# Patient Record
Sex: Male | Born: 1990 | Race: Black or African American | Hispanic: No | Marital: Single | Smoking: Never smoker
Health system: Southern US, Community
[De-identification: ages and names within clinical notes are randomized; demographics above are authoritative.]

## PROBLEM LIST (undated history)

## (undated) DIAGNOSIS — G129 Spinal muscular atrophy, unspecified: Secondary | ICD-10-CM

## (undated) DIAGNOSIS — T8859XA Other complications of anesthesia, initial encounter: Secondary | ICD-10-CM

## (undated) DIAGNOSIS — J302 Other seasonal allergic rhinitis: Secondary | ICD-10-CM

## (undated) DIAGNOSIS — Z993 Dependence on wheelchair: Secondary | ICD-10-CM

## (undated) DIAGNOSIS — K649 Unspecified hemorrhoids: Secondary | ICD-10-CM

## (undated) HISTORY — PX: WISDOM TOOTH EXTRACTION: SHX21

## (undated) HISTORY — PX: TENDON RELEASE: SHX230

## (undated) HISTORY — PX: MUSCLE BIOPSY: SHX716

## (undated) HISTORY — PX: BACK SURGERY: SHX140

---
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
## 2009-11-24 ENCOUNTER — Emergency Department (HOSPITAL_COMMUNITY): Admission: EM | Admit: 2009-11-24 | Discharge: 2009-11-25 | Payer: Self-pay | Admitting: Emergency Medicine

## 2011-10-08 DIAGNOSIS — G129 Spinal muscular atrophy, unspecified: Secondary | ICD-10-CM | POA: Insufficient documentation

## 2013-02-13 ENCOUNTER — Emergency Department (HOSPITAL_COMMUNITY)
Admission: EM | Admit: 2013-02-13 | Discharge: 2013-02-13 | Disposition: A | Discharge status: Home / Self Care | Payer: Medicaid Other | Attending: Emergency Medicine | Admitting: Emergency Medicine

## 2013-02-13 ENCOUNTER — Emergency Department (HOSPITAL_COMMUNITY): Payer: Medicaid Other

## 2013-02-13 ENCOUNTER — Encounter (HOSPITAL_COMMUNITY): Payer: Self-pay | Admitting: *Deleted

## 2013-02-13 DIAGNOSIS — Z8739 Personal history of other diseases of the musculoskeletal system and connective tissue: Secondary | ICD-10-CM | POA: Insufficient documentation

## 2013-02-13 DIAGNOSIS — R059 Cough, unspecified: Secondary | ICD-10-CM | POA: Insufficient documentation

## 2013-02-13 DIAGNOSIS — Z880 Allergy status to penicillin: Secondary | ICD-10-CM | POA: Insufficient documentation

## 2013-02-13 DIAGNOSIS — R6889 Other general symptoms and signs: Secondary | ICD-10-CM | POA: Insufficient documentation

## 2013-02-13 DIAGNOSIS — J9801 Acute bronchospasm: Secondary | ICD-10-CM | POA: Insufficient documentation

## 2013-02-13 DIAGNOSIS — Z79899 Other long term (current) drug therapy: Secondary | ICD-10-CM | POA: Insufficient documentation

## 2013-02-13 DIAGNOSIS — Z792 Long term (current) use of antibiotics: Secondary | ICD-10-CM | POA: Insufficient documentation

## 2013-02-13 DIAGNOSIS — R05 Cough: Secondary | ICD-10-CM | POA: Insufficient documentation

## 2013-02-13 HISTORY — DX: Other seasonal allergic rhinitis: J30.2

## 2013-02-13 HISTORY — DX: Spinal muscular atrophy, unspecified: G12.9

## 2013-02-13 MED ORDER — ALBUTEROL SULFATE HFA 108 (90 BASE) MCG/ACT IN AERS
2.0000 | INHALATION_SPRAY | Freq: Once | RESPIRATORY_TRACT | Status: AC
  Administered 2013-02-13: 2 via RESPIRATORY_TRACT
  Filled 2013-02-13: qty 6.7

## 2013-02-13 NOTE — ED Provider Notes (Signed)
History     CSN: [id number]  Arrival date & time 02/13/13  1931   First MD Initiated Contact with Patient 02/13/13 1948      Chief Complaint  Patient presents with  . Shortness of Breath  . Choking    (Consider location/radiation/quality/duration/timing/severity/associated sxs/prior treatment) HPI Comments: Patient comes to the ER for evaluation of difficulty breathing. Patient had a choking/coughing episode and felt short of breath. He used his albuterol inhaler and no shortness of breath completely resolved. He is back to his normal baseline. At arrival patient is in no distress. He has not had any fever.  Patient is a 22 y.o. male presenting with shortness of breath.  Shortness of Breath   Past Medical History  Diagnosis Date  . Spinal muscular atrophy   . Seasonal allergies     Past Surgical History  Procedure Laterality Date  . Back surgery    . Muscle biopsy    . Tendon release      History reviewed. No pertinent family history.  History  Substance Use Topics  . Smoking status: Never Smoker   . Smokeless tobacco: Not on file  . Alcohol Use: No      Review of Systems  Respiratory: Positive for shortness of breath.   All other systems reviewed and are negative.    Allergies  Penicillins  Home Medications   Current Outpatient Rx  Name  Route  Sig  Dispense  Refill  . albuterol (PROVENTIL HFA;VENTOLIN HFA) 108 (90 BASE) MCG/ACT inhaler   Inhalation   Inhale 2 puffs into the lungs every 6 (six) hours as needed for wheezing.         . clindamycin (CLEOCIN) 300 MG capsule   Oral   Take 300 mg by mouth 3 (three) times daily.         . naproxen sodium (ANAPROX) 220 MG tablet   Oral   Take 440 mg by mouth 2 (two) times daily as needed (for pain).           BP 130/84  Pulse 81  Temp(Src) 97.8 F (36.6 C) (Oral)  Resp 20  SpO2 100%  Physical Exam  Constitutional: He is oriented to person, place, and time. He appears well-developed and  well-nourished. No distress.  HENT:  Head: Normocephalic and atraumatic.  Right Ear: Hearing normal.  Nose: Nose normal.  Mouth/Throat: Oropharynx is clear and moist and mucous membranes are normal.  Eyes: Conjunctivae and EOM are normal. Pupils are equal, round, and reactive to light.  Neck: Normal range of motion. Neck supple.  Cardiovascular: Normal rate, regular rhythm, S1 normal and S2 normal.  Exam reveals no gallop and no friction rub.   No murmur heard. Pulmonary/Chest: Effort normal and breath sounds normal. No respiratory distress. He exhibits no tenderness.  Abdominal: Soft. Normal appearance and bowel sounds are normal. There is no hepatosplenomegaly. There is no tenderness. There is no rebound, no guarding, no tenderness at McBurney's point and negative Murphy's sign. No hernia.  Musculoskeletal: Normal range of motion.  Neurological: He is alert and oriented to person, place, and time. He has normal strength. No cranial nerve deficit. GCS eye subscore is 4. GCS verbal subscore is 5. GCS motor subscore is 6.  Skin: Skin is warm, dry and intact. No rash noted. No cyanosis.  Psychiatric: He has a normal mood and affect. His speech is normal and behavior is normal. Thought content normal.    ED Course  Procedures (including critical care time)  Labs Reviewed - No data to display Dg Chest 2 View  02/13/2013  *RADIOLOGY REPORT*  Clinical Data: Shortness of breath and cough.  CHEST - 2 VIEW  Comparison: 11/25/2009 chest radiograph  Findings: Cardiomegaly noted. There is no evidence of focal airspace disease, pulmonary edema, suspicious pulmonary nodule/mass, pleural effusion, or pneumothorax. No acute bony abnormalities are identified. Thoracic spinal fixation rods are again noted.  IMPRESSION: Cardiomegaly without evidence of acute cardiopulmonary disease.   Original Report Authenticated By: Harmon Pier, M.D.      Diagnoses: Shortness of breath secondary to bronchospasm    MDM   Patient came to the ER for evaluation of shortness of breath. He had an episode of coughing and feeling short of breath that resolved after he used his albuterol inhaler. At arrival lungs are completely clear. Oxygenation is 100%. All vital signs are normal. Chest x-ray was clear. Patient and parents were reassured, use albuterol as needed. No other treatment is necessary.        Gilda Crease, MD 02/13/13 2211

## 2013-02-13 NOTE — ED Notes (Signed)
Pt has spinal musculary atrophy, at 6:50 tonight, pt had episode of choking, felt he couldn't breathe.  Concern for something in throat.  Usually goes to [facility] for regular treatment of disease.

## 2013-07-15 ENCOUNTER — Encounter (HOSPITAL_COMMUNITY): Payer: Self-pay | Admitting: *Deleted

## 2013-07-15 ENCOUNTER — Emergency Department (HOSPITAL_COMMUNITY)
Admission: EM | Admit: 2013-07-15 | Discharge: 2013-07-15 | Disposition: A | Discharge status: Home / Self Care | Payer: Medicaid Other | Attending: Emergency Medicine | Admitting: Emergency Medicine

## 2013-07-15 DIAGNOSIS — H9209 Otalgia, unspecified ear: Secondary | ICD-10-CM | POA: Insufficient documentation

## 2013-07-15 DIAGNOSIS — Z792 Long term (current) use of antibiotics: Secondary | ICD-10-CM | POA: Insufficient documentation

## 2013-07-15 DIAGNOSIS — J029 Acute pharyngitis, unspecified: Secondary | ICD-10-CM | POA: Insufficient documentation

## 2013-07-15 DIAGNOSIS — Z79899 Other long term (current) drug therapy: Secondary | ICD-10-CM | POA: Insufficient documentation

## 2013-07-15 DIAGNOSIS — J069 Acute upper respiratory infection, unspecified: Secondary | ICD-10-CM | POA: Insufficient documentation

## 2013-07-15 DIAGNOSIS — H6120 Impacted cerumen, unspecified ear: Secondary | ICD-10-CM | POA: Insufficient documentation

## 2013-07-15 DIAGNOSIS — Z880 Allergy status to penicillin: Secondary | ICD-10-CM | POA: Insufficient documentation

## 2013-07-15 MED ORDER — PSEUDOEPHEDRINE HCL 60 MG PO TABS: 60.0000 mg | ORAL_TABLET | ORAL | Status: DC | PRN

## 2013-07-15 NOTE — ED Notes (Signed)
Pt states that he has been having URI (cold like symptoms) for the past couple of days. Pt states nasal congestion, and cough that is productive but not always and generalized body discomfort.

## 2013-07-15 NOTE — ED Provider Notes (Signed)
CSN: [id number]     Arrival date & time 07/15/13  2003 History  This chart was scribed for non-physician practitioner, Junious Silk, PA-C working with Shelda Jakes, MD by Greggory Stallion, ED scribe. This patient was seen in room TR10C/TR10C and the patient's care was started at 10:06 PM.   Chief Complaint  Patient presents with  . URI   The history is provided by the patient. No language interpreter was used.    HPI Comments: Wayne Nichols is a 22 y.o. male who presents to the Emergency Department complaining of gradual onset, constant nasal congestion, sore throat and clear rhinorrhea that started 2 days ago. He states he has left ear pain and sometimes has a productive cough. The cough is worse at night. Pt denies trouble breathing, SOB and fever. He believes possibly one of his classmates is sick with the same symptoms.   Past Medical History  Diagnosis Date  . Spinal muscular atrophy   . Seasonal allergies    Past Surgical History  Procedure Laterality Date  . Back surgery    . Muscle biopsy    . Tendon release     History reviewed. No pertinent family history. History  Substance Use Topics  . Smoking status: Never Smoker   . Smokeless tobacco: Not on file  . Alcohol Use: No    Review of Systems  Constitutional: Negative for fever.  HENT: Positive for ear pain, congestion and rhinorrhea.   Respiratory: Positive for cough. Negative for shortness of breath.   All other systems reviewed and are negative.    Allergies  Penicillins  Home Medications   Current Outpatient Rx  Name  Route  Sig  Dispense  Refill  . albuterol (PROVENTIL HFA;VENTOLIN HFA) 108 (90 BASE) MCG/ACT inhaler   Inhalation   Inhale 2 puffs into the lungs every 6 (six) hours as needed for wheezing.         . clindamycin (CLEOCIN) 300 MG capsule   Oral   Take 300 mg by mouth 3 (three) times daily.         . naproxen sodium (ANAPROX) 220 MG tablet   Oral   Take 440 mg by mouth 2  (two) times daily as needed (for pain).          BP 127/78  Pulse 89  Temp(Src) 98 F (36.7 C) (Oral)  Resp 16  SpO2 98%  Physical Exam  Nursing note and vitals reviewed. Constitutional: He is oriented to person, place, and time. He appears well-developed and well-nourished. No distress.  HENT:  Head: Normocephalic and atraumatic.  Right Ear: Tympanic membrane, external ear and ear canal normal.  Left Ear: External ear normal.  Nose: Nose normal. Right sinus exhibits no maxillary sinus tenderness and no frontal sinus tenderness. Left sinus exhibits no maxillary sinus tenderness and no frontal sinus tenderness.  Mouth/Throat: Uvula is midline, oropharynx is clear and moist and mucous membranes are normal.  Cerumen impaction on left  Eyes: Conjunctivae are normal.  Neck: Normal range of motion. No tracheal deviation present.  Cardiovascular: Normal rate, regular rhythm and normal heart sounds.   Pulmonary/Chest: Effort normal and breath sounds normal. No stridor. He has no decreased breath sounds. He has no wheezes. He has no rhonchi. He has no rales.  Abdominal: Soft. He exhibits no distension. There is no tenderness.  Musculoskeletal: Normal range of motion.  Neurological: He is alert and oriented to person, place, and time. He displays atrophy.  Skin: Skin is  warm and dry. He is not diaphoretic.  Psychiatric: He has a normal mood and affect. His behavior is normal.    ED Course  Procedures (including critical care time)  DIAGNOSTIC STUDIES: Oxygen Saturation is 98% on RA, normal by my interpretation.    COORDINATION OF CARE: 10:08 PM-Discussed treatment plan which includes cleaning out left ear with pt at bedside and pt agreed to plan.   Labs Review Labs Reviewed - No data to display Imaging Review No results found.  MDM   1. URI (upper respiratory infection)    Patients symptoms are consistent with URI, likely viral etiology. Lungs CTA. Oxygen sats 98% on RA.  Discussed that antibiotics are not indicated for viral infections. Pt will be discharged with symptomatic treatment.  Verbalizes understanding and is agreeable with plan. Pt is hemodynamically stable & in NAD prior to dc.  Patient also with cerumen impaction. After ear was flushed out by nurse, patient reports he has no ear pain.    I personally performed the services described in this documentation, which was scribed in my presence. The recorded information has been reviewed and is accurate.    Mora Bellman, PA-C 07/16/13 1244

## 2013-07-22 NOTE — ED Provider Notes (Signed)
Medical screening examination/treatment/procedure(s) were performed by non-physician practitioner and as supervising physician I was immediately available for consultation/collaboration.   Kyara Boxer W. Joniel Graumann, MD 07/22/13 2357 

## 2014-12-10 ENCOUNTER — Encounter (HOSPITAL_COMMUNITY): Payer: Self-pay | Admitting: Emergency Medicine

## 2014-12-10 ENCOUNTER — Emergency Department (HOSPITAL_COMMUNITY)
Admission: EM | Admit: 2014-12-10 | Discharge: 2014-12-10 | Disposition: A | Discharge status: Home / Self Care | Payer: Medicaid Other | Attending: Emergency Medicine | Admitting: Emergency Medicine

## 2014-12-10 DIAGNOSIS — Z880 Allergy status to penicillin: Secondary | ICD-10-CM | POA: Diagnosis not present

## 2014-12-10 DIAGNOSIS — Z8669 Personal history of other diseases of the nervous system and sense organs: Secondary | ICD-10-CM | POA: Diagnosis not present

## 2014-12-10 DIAGNOSIS — K645 Perianal venous thrombosis: Secondary | ICD-10-CM

## 2014-12-10 DIAGNOSIS — Z79899 Other long term (current) drug therapy: Secondary | ICD-10-CM | POA: Insufficient documentation

## 2014-12-10 HISTORY — DX: Unspecified hemorrhoids: K64.9

## 2014-12-10 MED ORDER — LIDOCAINE-EPINEPHRINE 1 %-1:100000 IJ SOLN
10.0000 mL | Freq: Once | INTRAMUSCULAR | Status: AC
  Administered 2014-12-10: 10 mL via INTRADERMAL
  Filled 2014-12-10: qty 1

## 2014-12-10 NOTE — ED Provider Notes (Signed)
CSN: [id number]     Arrival date & time 12/10/14  0004 History  This chart was scribed for Tomasita CrumbleAdeleke Dwayn Moravek, MD by Abel PrestoKara Demonbreun, ED Scribe. This patient was seen in room B18C/B18C and the patient's care was started at 12:59 AM.      Chief Complaint  Patient presents with  . Hemorrhoids      The history is provided by the patient. No language interpreter was used.   HPI Comments: Wayne Nichols is a 24 y.o. male with PMHx of spinal muscular atrophy who presents to the Emergency Department complaining of hemorrhoid swelling with onset 4 days ago. Pt notes associated diarrhea. Pt with h/o hemorrhoids  But never this severe. Pt denies constipation, nausea, vomiting, anal bleeding, and abdominal pain.   Past Medical History  Diagnosis Date  . Spinal muscular atrophy   . Seasonal allergies   . Hemorrhoids    Past Surgical History  Procedure Laterality Date  . Back surgery    . Muscle biopsy    . Tendon release     No family history on file. History  Substance Use Topics  . Smoking status: Never Smoker   . Smokeless tobacco: Not on file  . Alcohol Use: No    Review of Systems A complete 10 system review of systems was obtained and all systems are negative except as noted in the HPI and PMH.     Allergies  Penicillins  Home Medications   Prior to Admission medications   Medication Sig Start Date End Date Taking? Authorizing Provider  albuterol (PROVENTIL HFA;VENTOLIN HFA) 108 (90 BASE) MCG/ACT inhaler Inhale 2 puffs into the lungs every 6 (six) hours as needed for wheezing.   Yes Historical Provider, MD  naproxen sodium (ANAPROX) 220 MG tablet Take 220-440 mg by mouth daily as needed (pain).   Yes Historical Provider, MD  phenylephrine-shark liver oil-mineral oil-petrolatum (PREPARATION H) 0.25-3-14-71.9 % rectal ointment Place 1 application rectally 2 (two) times daily as needed for hemorrhoids.   Yes Historical Provider, MD  ibuprofen (ADVIL,MOTRIN) 200 MG tablet Take  200-400 mg by mouth every 6 (six) hours as needed for moderate pain.     Historical Provider, MD  loratadine (CLARITIN) 10 MG tablet Take 10 mg by mouth daily.    Historical Provider, MD  pseudoephedrine (SUDAFED) 60 MG tablet Take 1 tablet (60 mg total) by mouth every 4 (four) hours as needed for congestion. Patient not taking: Reported on 12/10/2014 07/15/13   Mora BellmanHannah S Merrell, PA-C   BP 112/78 mmHg  Pulse 85  Temp(Src) 98.3 F (36.8 C) (Oral)  Resp 14  Ht 5\' 4"  (1.626 m)  Wt 108 lb (48.988 kg)  BMI 18.53 kg/m2  SpO2 97% Physical Exam  Constitutional: He is oriented to person, place, and time. Vital signs are normal. He appears well-developed and well-nourished.  Non-toxic appearance. He does not appear ill. No distress.  HENT:  Head: Normocephalic and atraumatic.  Nose: Nose normal.  Mouth/Throat: Oropharynx is clear and moist. No oropharyngeal exudate.  Eyes: Conjunctivae and EOM are normal. Pupils are equal, round, and reactive to light. No scleral icterus.  Neck: Normal range of motion. Neck supple. No tracheal deviation, no edema, no erythema and normal range of motion present. No thyroid mass and no thyromegaly present.  Cardiovascular: Normal rate, regular rhythm, S1 normal, S2 normal, normal heart sounds, intact distal pulses and normal pulses.  Exam reveals no gallop and no friction rub.   No murmur heard. Pulses:  Radial pulses are 2+ on the right side, and 2+ on the left side.       Dorsalis pedis pulses are 2+ on the right side, and 2+ on the left side.  Pulmonary/Chest: Effort normal and breath sounds normal. No respiratory distress. He has no wheezes. He has no rhonchi. He has no rales.  Abdominal: Soft. Normal appearance and bowel sounds are normal. He exhibits no distension, no ascites and no mass. There is no hepatosplenomegaly. There is no tenderness. There is no rebound, no guarding and no CVA tenderness.  Genitourinary:  3 cm external hemorrhoid at the 7 o'clock  position  Musculoskeletal: Normal range of motion. He exhibits no edema or tenderness.  Muscular dystrophy LE  Lymphadenopathy:    He has no cervical adenopathy.  Neurological: He is alert and oriented to person, place, and time. He has normal strength. No cranial nerve deficit or sensory deficit.  Skin: Skin is warm, dry and intact. No petechiae and no rash noted. He is not diaphoretic. No erythema. No pallor.  Psychiatric: He has a normal mood and affect. His behavior is normal. Judgment normal.  Nursing note and vitals reviewed.   ED Course  Procedures (including critical care time) DIAGNOSTIC STUDIES: Oxygen Saturation is 97% on room air, normal by my interpretation.    COORDINATION OF CARE: 1:02 AM Discussed treatment plan with patient at beside, the patient agrees with the plan and has no further questions at this time.   INCISION AND DRAINAGE PROCEDURE NOTE: Patient identification was confirmed and verbal consent was obtained. This procedure was performed by Tomasita Crumble, MD at 1:42 AM. Site: external  Anal hemorrhoid Sterile procedures observed Needle size: 25 g Anesthetic used (type and amt): lidocaine 1% with Epi Blade size: #11  Drainage: thrombosis Complexity: Complex Packing used: none Site anesthetized, incision made over site, wound drained and explored loculations, rinsed with copious amounts of normal saline, covered with dry, sterile dressing.  Pt tolerated procedure well without complications.  Instructions for care discussed verbally and pt provided with additional written instructions for homecare and f/u.   Labs Review Labs Reviewed - No data to display  Imaging Review No results found.   EKG Interpretation None      MDM   Final diagnoses:  None   patient presents emergency department for pain at his hemorrhoid site. On exam a thrombosed hemorrhoid was seen. C incision and drainage noted above. Patient states his pain is improved after the  procedure. Education was given including sitz bath and need for stool softeners if there is history of constipation. Patient's vital signs were within his normal limits he is safe for discharge.  I personally performed the services described in this documentation, which was scribed in my presence. The recorded information has been reviewed and is accurate.    Tomasita Crumble, MD 12/10/14 0230

## 2014-12-10 NOTE — ED Notes (Signed)
Will need to weigh patient once on bed. Patient does not know how much his wheelchair weighs.

## 2014-12-10 NOTE — ED Notes (Signed)
Placed on stretcher and clothing removed.  Family at the bedside.  No distress noted at this time.  Encouraged to call for assistance as needed.

## 2014-12-10 NOTE — ED Notes (Signed)
Pt. reports hemorrhoid pain with swelling onset Monday this week with no bleeding .

## 2014-12-10 NOTE — Discharge Instructions (Signed)
Hemorrhoids Wayne Nichols, your hemorrhoid was opened and drained.  Take sitz baths to prevent further hemorrhoids.   Follow-up with a primary care physician within 3 days for continued management. If symptoms worsen come back to emergency department immediately. Thank you. Hemorrhoids are puffy (swollen) veins around the rectum or anus. Hemorrhoids can cause pain, itching, bleeding, or irritation. HOME CARE 1. Eat foods with fiber, such as whole grains, beans, nuts, fruits, and vegetables. Ask your doctor about taking products with added fiber in them (fibersupplements). 2. Drink enough fluid to keep your pee (urine) clear or pale yellow. 3. Exercise often. 4. Go to the bathroom when you have the urge to poop. Do not wait. 5. Avoid straining to poop (bowel movement). 6. Keep the butt area dry and clean. Use wet toilet paper or moist paper towels. 7. Medicated creams and medicine inserted into the anus (anal suppository) may be used or applied as told. 8. Only take medicine as told by your doctor. 9. Take a warm water bath (sitz bath) for 15-20 minutes to ease pain. Do this 3-4 times a day. 10. Place ice packs on the area if it is tender or puffy. Use the ice packs between the warm water baths. 1. Put ice in a plastic bag. 2. Place a towel between your skin and the bag. 3. Leave the ice on for 15-20 minutes, 03-04 times a day. 11. Do not use a donut-shaped pillow or sit on the toilet for a long time. GET HELP RIGHT AWAY IF:   You have more pain that is not controlled by treatment or medicine.  You have bleeding that will not stop.  You have trouble or are unable to poop (bowel movement).  You have pain or puffiness outside the area of the hemorrhoids. MAKE SURE YOU:   Understand these instructions.  Will watch your condition.  Will get help right away if you are not doing well or get worse. Document Released: 07/17/2008 Document Revised: 09/24/2012 Document Reviewed:  08/19/2012 [facility] Patient Information 2015 [place], [place]. This information is not intended to replace advice given to you by your health care provider. Make sure you discuss any questions you have with your health care provider.  Sitz Bath A sitz bath is a warm water bath taken in the sitting position. The water covers only the hips and butt (buttocks). It may be used for either healing or cleaning purposes. Sitz baths are also used to relieve pain, itching, or muscle tightening (spasms). The water may contain medicine. Moist heat will help you heal and relax.  HOME CARE  Take 3 to 4 sitz baths a day. 12. Fill the bathtub half-full with warm water. 13. Sit in the water and open the drain a little. 14. Turn on the warm water to keep the tub half-full. Keep the water running constantly. 15. Soak in the water for 15 to 20 minutes. 16. After the sitz bath, pat the affected area dry. GET HELP RIGHT AWAY IF: You get worse instead of better. Stop the sitz baths if you get worse. MAKE SURE YOU:  Understand these instructions.  Will watch your condition.  Will get help right away if you are not doing well or get worse. Document Released: 11/15/2004 Document Revised: 07/02/2012 Document Reviewed: 02/05/2011 [facility] Patient Information 2015 [place], [place]. This information is not intended to replace advice given to you by your health care provider. Make sure you discuss any questions you have with your health care provider.

## 2014-12-21 ENCOUNTER — Ambulatory Visit: Payer: Medicaid Other | Attending: Internal Medicine | Admitting: Internal Medicine

## 2014-12-21 ENCOUNTER — Encounter: Payer: Self-pay | Admitting: Internal Medicine

## 2014-12-21 VITALS — BP 108/76 | HR 101 | Temp 98.1°F | Resp 16

## 2014-12-21 DIAGNOSIS — K649 Unspecified hemorrhoids: Secondary | ICD-10-CM

## 2014-12-21 DIAGNOSIS — Z2821 Immunization not carried out because of patient refusal: Secondary | ICD-10-CM | POA: Diagnosis not present

## 2014-12-21 DIAGNOSIS — L709 Acne, unspecified: Secondary | ICD-10-CM

## 2014-12-21 DIAGNOSIS — Z993 Dependence on wheelchair: Secondary | ICD-10-CM | POA: Insufficient documentation

## 2014-12-21 DIAGNOSIS — G129 Spinal muscular atrophy, unspecified: Secondary | ICD-10-CM | POA: Diagnosis not present

## 2014-12-21 NOTE — Progress Notes (Signed)
Pt is here to establish care. Pt was recently in the ED with hemorrhoids. Pt is treating with preparation h. Pt states that he is a lot better.

## 2014-12-21 NOTE — Patient Instructions (Signed)

## 2014-12-21 NOTE — Progress Notes (Signed)
Patient ID: ROWAN BLAKER, male   DOB: Jul 07, 1991, 24 y.o.   MRN: [id number]  CC: hemorrhoids   HPI: Artavis Cowie is a 24 y.o. male here today to establish care.  Patient has past medical history of spinal muscular atrophy.  He was recently seen in the ER for external thrombosed hemorrhoids. At that time he had a I&D of the hemorrhoid.  Patient also c/o of facial and back acne. He has tried clearogel and proactive with minimal relief.   Patient has No headache, No chest pain, No abdominal pain - No Nausea, No new weakness tingling or numbness, No Cough - SOB.  Allergies  Allergen Reactions  . Penicillins Hives   Past Medical History  Diagnosis Date  . Spinal muscular atrophy   . Seasonal allergies   . Hemorrhoids    Current Outpatient Prescriptions on File Prior to Visit  Medication Sig Dispense Refill  . albuterol (PROVENTIL HFA;VENTOLIN HFA) 108 (90 BASE) MCG/ACT inhaler Inhale 2 puffs into the lungs every 6 (six) hours as needed for wheezing.    Marland Kitchen ibuprofen (ADVIL,MOTRIN) 200 MG tablet Take 200-400 mg by mouth every 6 (six) hours as needed for moderate pain.     Marland Kitchen loratadine (CLARITIN) 10 MG tablet Take 10 mg by mouth daily.    . naproxen sodium (ANAPROX) 220 MG tablet Take 220-440 mg by mouth daily as needed (pain).    . phenylephrine-shark liver oil-mineral oil-petrolatum (PREPARATION H) 0.25-3-14-71.9 % rectal ointment Place 1 application rectally 2 (two) times daily as needed for hemorrhoids.    . pseudoephedrine (SUDAFED) 60 MG tablet Take 1 tablet (60 mg total) by mouth every 4 (four) hours as needed for congestion. (Patient not taking: Reported on 12/10/2014) 30 tablet 0   No current facility-administered medications on file prior to visit.   Family History  Problem Relation Age of Onset  . Stroke Maternal Grandmother   . Heart disease Maternal Grandmother    History   Social History  . Marital Status: Single    Spouse Name: N/A  . Number of Children: N/A  .  Years of Education: N/A   Occupational History  . Not on file.   Social History Main Topics  . Smoking status: Never Smoker   . Smokeless tobacco: Not on file  . Alcohol Use: No  . Drug Use: No  . Sexual Activity: Not on file   Other Topics Concern  . Not on file   Social History Narrative    Review of Systems: See HPI   Objective:   Filed Vitals:   12/21/14 1707  BP: 108/76  Pulse: 101  Temp: 98.1 F (36.7 C)  Resp: 16    Physical Exam  Constitutional: He is oriented to person, place, and time.  Cardiovascular: Normal rate, regular rhythm and normal heart sounds.   Pulmonary/Chest: Effort normal and breath sounds normal.  Musculoskeletal:  BLE muscle atropy, limited to wheelchair  Neurological: He is alert and oriented to person, place, and time.  Skin: Skin is warm and dry.     No results found for: WBC, HGB, HCT, MCV, PLT No results found for: CREATININE, BUN, NA, K, CL, CO2  No results found for: HGBA1C Lipid Panel  No results found for: CHOL, TRIG, HDL, CHOLHDL, VLDL, LDLCALC     Assessment and plan:   Edelmiro was seen today for establish care.  Diagnoses and all orders for this visit:  Hemorrhoids, unspecified hemorrhoid type May continue to use preparation H. Gave patient  s/s of when to return to clinic  Acne, unspecified acne type Orders: -     Ambulatory referral to Dermatology  Refused influenza vaccine Explained that annual influenza is recommended per CDC guidelines and is highly suggested to anyone who has has CHF, COPD, DM or immunocompromised. Benefits of influenza described in detail.   Return in about 3 months (around 03/23/2015), or if symptoms worsen or fail to improve.       Holland CommonsKECK, VALERIE, NP-C [facility] [phone] 12/21/2014, 5:27 PM

## 2015-01-27 ENCOUNTER — Encounter: Payer: Self-pay | Admitting: Internal Medicine

## 2015-01-27 ENCOUNTER — Other Ambulatory Visit: Payer: Self-pay | Admitting: *Deleted

## 2015-01-27 DIAGNOSIS — J302 Other seasonal allergic rhinitis: Secondary | ICD-10-CM

## 2015-01-27 DIAGNOSIS — Z889 Allergy status to unspecified drugs, medicaments and biological substances status: Secondary | ICD-10-CM

## 2015-01-27 MED ORDER — LORATADINE 10 MG PO TABS: 10.0000 mg | ORAL_TABLET | Freq: Every day | ORAL | Status: DC | PRN

## 2015-01-27 NOTE — Progress Notes (Signed)
Patient arrived this am asking to be seen for sinus congestion and headache.  States that he has a h/o seasonal allergies.  He took one of his Mom's Claritin yesterday with significant relief.  Denies cough and fever.  Temp orally was 97.7.   A review of his medications showed that he has used Claritin before.   A refill for 30 tablets was provided and patient instructed to call us if sx worsen.  Patient agreeable.

## 2015-02-01 ENCOUNTER — Telehealth: Payer: Self-pay | Admitting: Internal Medicine

## 2015-02-01 NOTE — Telephone Encounter (Signed)
Debbie from [facility] called to check on the status of the transportation application she dropped off yesterday. Please f/u

## 2015-02-01 NOTE — Telephone Encounter (Signed)
dustin please explain the 2 week policy for returning paperwork to patient

## 2015-02-08 NOTE — Telephone Encounter (Signed)
Ms. Wayne PackerDebbie Nichols from [facility] came by the clinic to follow up with paperwork that needs to be signed in order for patient to receive transportation assistance [facility] Ms. Wayne Nichols stated that the documentation only needs a signature and reason for assistance with transportation. Please f/u with representative at [phone]

## 2015-02-15 NOTE — Telephone Encounter (Signed)
Reece PackerDebbie Parker called to check on the status of his paperwork that needs to be filled out for the patients transportation. Please f/u

## 2015-02-18 ENCOUNTER — Encounter: Payer: Self-pay | Admitting: Internal Medicine

## 2015-02-18 ENCOUNTER — Ambulatory Visit: Payer: Medicaid Other | Attending: Internal Medicine | Admitting: Internal Medicine

## 2015-02-18 VITALS — BP 105/72 | HR 86 | Temp 98.0°F | Resp 16

## 2015-02-18 DIAGNOSIS — G129 Spinal muscular atrophy, unspecified: Secondary | ICD-10-CM | POA: Insufficient documentation

## 2015-02-18 DIAGNOSIS — J302 Other seasonal allergic rhinitis: Secondary | ICD-10-CM | POA: Diagnosis not present

## 2015-02-18 DIAGNOSIS — J309 Allergic rhinitis, unspecified: Secondary | ICD-10-CM | POA: Diagnosis not present

## 2015-02-18 DIAGNOSIS — R220 Localized swelling, mass and lump, head: Secondary | ICD-10-CM | POA: Diagnosis not present

## 2015-02-18 MED ORDER — FLUTICASONE PROPIONATE 50 MCG/ACT NA SUSP: 2.0000 | Freq: Every day | NASAL | Status: DC

## 2015-02-18 NOTE — Progress Notes (Signed)
Patient ID: Wayne Nichols, male   DOB: 05/01/1991, 24 y.o.   MRN: [id number]  CC: allergies  HPI: Wayne Nichols is a 24 y.o. male here today for a follow up visit.  Patient has past medical history of spinal musular atropy and hemorrhoids.  He presents today with c/o of seasonal allergies for over 1 week.  He reports that he uses his claritin daily but he has not had any relief.  He has symptoms of cough and rhinitis.  He has tried Delsym, honey, and hot tea without relief.   Patient has No headache, No chest pain, No abdominal pain - No Nausea, No new weakness tingling or numbness, No Cough - SOB.  Allergies  Allergen Reactions  . Penicillins Hives   Past Medical History  Diagnosis Date  . Spinal muscular atrophy   . Seasonal allergies   . Hemorrhoids    Current Outpatient Prescriptions on File Prior to Visit  Medication Sig Dispense Refill  . albuterol (PROVENTIL HFA;VENTOLIN HFA) 108 (90 BASE) MCG/ACT inhaler Inhale 2 puffs into the lungs every 6 (six) hours as needed for wheezing.    Marland Kitchen loratadine (CLARITIN) 10 MG tablet Take 1 tablet (10 mg total) by mouth daily as needed for allergies. 30 tablet 1  . ibuprofen (ADVIL,MOTRIN) 200 MG tablet Take 200-400 mg by mouth every 6 (six) hours as needed for moderate pain.     . naproxen sodium (ANAPROX) 220 MG tablet Take 220-440 mg by mouth daily as needed (pain).    . phenylephrine-shark liver oil-mineral oil-petrolatum (PREPARATION H) 0.25-3-14-71.9 % rectal ointment Place 1 application rectally 2 (two) times daily as needed for hemorrhoids.    . pseudoephedrine (SUDAFED) 60 MG tablet Take 1 tablet (60 mg total) by mouth every 4 (four) hours as needed for congestion. (Patient not taking: Reported on 12/10/2014) 30 tablet 0   No current facility-administered medications on file prior to visit.   Family History  Problem Relation Age of Onset  . Stroke Maternal Grandmother   . Heart disease Maternal Grandmother    History   Social  History  . Marital Status: Single    Spouse Name: N/A  . Number of Children: N/A  . Years of Education: N/A   Occupational History  . Not on file.   Social History Main Topics  . Smoking status: Never Smoker   . Smokeless tobacco: Not on file  . Alcohol Use: No  . Drug Use: No  . Sexual Activity: Not on file   Other Topics Concern  . Not on file   Social History Narrative    Review of Systems  Constitutional: Negative.   HENT: Positive for congestion.   Neurological: Negative for dizziness and headaches.  All other systems reviewed and are negative.     Objective:   Filed Vitals:   02/18/15 0909  BP: 105/72  Pulse: 86  Temp: 98 F (36.7 C)  Resp: 16    Physical Exam  HENT:  Mouth/Throat: Oropharynx is clear and moist.  Bilateral nasal turbinates---polyps?  Eyes: Pupils are equal, round, and reactive to light. Right eye exhibits no discharge. Left eye exhibits no discharge.  Cardiovascular: Normal rate, regular rhythm and normal heart sounds.   Pulmonary/Chest: Effort normal and breath sounds normal.     No results found for: WBC, HGB, HCT, MCV, PLT No results found for: CREATININE, BUN, NA, K, CL, CO2  No results found for: HGBA1C Lipid Panel  No results found for: CHOL, TRIG, HDL, CHOLHDL,  VLDL, LDLCALC     Assessment and plan:  Wayne Nichols was seen today for follow-up.  Diagnoses and all orders for this visit:  Nasal swelling Orders: -     fluticasone (FLONASE) 50 MCG/ACT nasal spray; Place 2 sprays into both nostrils daily. Unable to tell if patient has nasal polyps due to severe swelling of turbinates.  Will bring back in one week to view turbinates  Other seasonal allergic rhinitis Continue claritin   Return in about 1 week (around 02/25/2015) for Nurse Visit-nasal r/o.       Holland CommonsKECK, VALERIE, NP-C [facility] [phone] 02/18/2015, 9:32 AM

## 2015-02-18 NOTE — Progress Notes (Signed)
Pt is here following up on his allergy's. Pt states that he uses a roll of TP everyday blowing his nose.

## 2015-03-01 ENCOUNTER — Ambulatory Visit: Payer: Medicaid Other | Attending: Internal Medicine | Admitting: Internal Medicine

## 2015-03-01 DIAGNOSIS — J343 Hypertrophy of nasal turbinates: Secondary | ICD-10-CM

## 2015-03-01 DIAGNOSIS — G129 Spinal muscular atrophy, unspecified: Secondary | ICD-10-CM | POA: Insufficient documentation

## 2015-03-01 NOTE — Progress Notes (Signed)
Patient ID: Wayne Nichols, male   DOB: [birth year], 24 y.o.   MRN: [id number]  Patient seen for a follow up of swollen nasal turbinates. Patient reports that he has been taking flonase and claritin as prescribed and feels much better. He is able to breathe better and no longer has nasal drainage.   Physical exam reveals hypertrophy of nasal turbinates  I have explained to patient that if he continues to have problems with breathing he may need a ENT referral in future. Patient advised to continue flonase and claritin through allergy season.   Holland CommonsKECK, Trisha Ken, NP 03/01/2015 4:13 PM

## 2015-03-27 ENCOUNTER — Other Ambulatory Visit: Payer: Self-pay | Admitting: Internal Medicine

## 2015-04-19 ENCOUNTER — Telehealth: Payer: Self-pay

## 2015-04-19 NOTE — Telephone Encounter (Signed)
Pt mother is requesting an order to have patient a home health aid M-F. She states it becoming to much to care for him with an hurt back and caring for her mother who has an heart condition. Can

## 2015-05-05 ENCOUNTER — Telehealth: Payer: Self-pay | Admitting: Internal Medicine

## 2015-05-05 NOTE — Telephone Encounter (Signed)
[facility] came into office to drop off PCS assessment form to be completed. Please contact Stanton KidneyDebra @[phone]

## 2015-05-10 ENCOUNTER — Telehealth: Payer: Self-pay | Admitting: General Practice

## 2015-05-10 NOTE — Telephone Encounter (Signed)
[facility] came into office to follow up status of paperwork that she dropped off on 05/05/15. Please assist. Please contact Debra @ [phone]

## 2015-05-17 ENCOUNTER — Telehealth: Payer: Self-pay | Admitting: Internal Medicine

## 2015-05-17 NOTE — Telephone Encounter (Signed)
Patient's father called requesting medication refill for :albuterol (PROVENTIL HFA;VENTOLIN HFA) 108 (90 BASE) MCG/ACT inhaler. Please f/u

## 2015-05-20 ENCOUNTER — Telehealth: Payer: Self-pay | Admitting: Internal Medicine

## 2015-05-20 NOTE — Telephone Encounter (Signed)
Wayne Nichols from [facility] came into office to drop off form to be completed. Please contact Debra -[phone], pt. Is requesting a hospital bed

## 2015-05-26 NOTE — Telephone Encounter (Signed)
Rolland Hariston came into office to pick up [facility] Request for prior approval

## 2015-06-16 ENCOUNTER — Telehealth: Payer: Self-pay | Admitting: Internal Medicine

## 2015-06-16 NOTE — Telephone Encounter (Signed)
A patient representative called requesting a hospital bed for patient. Patient stated she had dropped paperwork off on July 29 th. Patient's representative would like a call from the nurse. Please follow up.

## 2015-07-08 ENCOUNTER — Telehealth: Payer: Self-pay

## 2015-07-08 ENCOUNTER — Other Ambulatory Visit: Payer: Self-pay

## 2015-07-08 DIAGNOSIS — G129 Spinal muscular atrophy, unspecified: Secondary | ICD-10-CM

## 2015-07-08 NOTE — Telephone Encounter (Signed)
Spoke with debra from [employer] [facility] Requesting a prescription for a hospital bed for this patient Prescription printed and faxed to [phone]

## 2015-07-18 NOTE — Telephone Encounter (Signed)
Stanton Kidney from [facility] presents to clinic to confirm receipt of prescriptions that was faxed on 07/08/15. Stanton Kidney states that although the prescriptions were received, the medical supply company is requesting for the prescriptions to be sent separately. Stanton Kidney states specifically, they should state as follows (2 diff scripts)   1. "Full elect bed"    2. "trapez bar"

## 2015-07-26 ENCOUNTER — Telehealth: Payer: Self-pay | Admitting: Internal Medicine

## 2015-07-26 DIAGNOSIS — M6250 Muscle wasting and atrophy, not elsewhere classified, unspecified site: Secondary | ICD-10-CM

## 2015-07-26 NOTE — Telephone Encounter (Signed)
Rep from [facility] dropped of [place] [facility] request for prior approval, its the only piece of document needed for patient to receive equipment. Please call Wayne Nichols when paperwork is ready, she states this would need to be completed as soon as possible. Request for prescription Trapeze bar is also pending, she has not received prescription for this item.

## 2015-08-04 NOTE — Telephone Encounter (Signed)
Debra from [facility] called requesting to speak to nurse, concerning pt. Paperwork. She is aware that the paperwork was faxed back but she still has further questions to ask. Please f/u.

## 2015-08-05 ENCOUNTER — Other Ambulatory Visit: Payer: Self-pay

## 2015-08-05 MED ORDER — ALBUTEROL SULFATE HFA 108 (90 BASE) MCG/ACT IN AERS: 2.0000 | INHALATION_SPRAY | Freq: Four times a day (QID) | RESPIRATORY_TRACT | Status: DC | PRN

## 2015-08-05 NOTE — Telephone Encounter (Signed)
Debra from [facility] called requesting to speak to nurse in the regards to  the pt. Paperwork. She is aware of the paperwork being faxed but she has a question to ask the nurse. Please f/u

## 2015-08-12 ENCOUNTER — Other Ambulatory Visit: Payer: Self-pay

## 2015-08-12 DIAGNOSIS — M6250 Muscle wasting and atrophy, not elsewhere classified, unspecified site: Secondary | ICD-10-CM

## 2015-08-15 ENCOUNTER — Ambulatory Visit: Payer: Medicaid Other | Attending: Internal Medicine | Admitting: Internal Medicine

## 2015-08-15 ENCOUNTER — Encounter: Payer: Self-pay | Admitting: Internal Medicine

## 2015-08-15 VITALS — BP 107/74 | HR 93 | Temp 98.0°F | Resp 16

## 2015-08-15 DIAGNOSIS — IMO0001 Reserved for inherently not codable concepts without codable children: Secondary | ICD-10-CM

## 2015-08-15 DIAGNOSIS — Z021 Encounter for pre-employment examination: Secondary | ICD-10-CM | POA: Insufficient documentation

## 2015-08-15 DIAGNOSIS — R69 Illness, unspecified: Secondary | ICD-10-CM

## 2015-08-15 MED ORDER — ALBUTEROL SULFATE HFA 108 (90 BASE) MCG/ACT IN AERS: 2.0000 | INHALATION_SPRAY | Freq: Four times a day (QID) | RESPIRATORY_TRACT | Status: DC | PRN

## 2015-08-15 NOTE — Progress Notes (Signed)
Patient ID: Wayne Nichols, male   DOB: 1991-05-15, 24 y.o.   MRN: [id number]  Patient reports that he needs form for job completed. Review of form shows that patient should be fasting for all blood work. I will send him home and reschedule him for next week for fasting blood work and form completion.   Ambrose FinlandValerie A Keck, NP 08/15/2015 5:21 PM

## 2015-08-15 NOTE — Progress Notes (Signed)
Patient here with family Patient needs a form for work filled out Patient declined the flu vaccine

## 2015-08-22 ENCOUNTER — Ambulatory Visit: Payer: Medicaid Other | Attending: Internal Medicine | Admitting: Internal Medicine

## 2015-08-22 ENCOUNTER — Encounter: Payer: Self-pay | Admitting: Internal Medicine

## 2015-08-22 VITALS — BP 116/76 | HR 85 | Temp 98.0°F | Resp 16

## 2015-08-22 DIAGNOSIS — Z0000 Encounter for general adult medical examination without abnormal findings: Secondary | ICD-10-CM | POA: Diagnosis present

## 2015-08-22 DIAGNOSIS — Z2821 Immunization not carried out because of patient refusal: Secondary | ICD-10-CM | POA: Insufficient documentation

## 2015-08-22 LAB — CBC WITH DIFFERENTIAL/PLATELET
Basophils Absolute: 0 10*3/uL (ref 0.0–0.1)
Basophils Relative: 0 % (ref 0–1)
Eosinophils Absolute: 0 10*3/uL (ref 0.0–0.7)
Eosinophils Relative: 1 % (ref 0–5)
HCT: 45 % (ref 39.0–52.0)
HEMOGLOBIN: 14.4 g/dL (ref 13.0–17.0)
LYMPHS PCT: 47 % — AB (ref 12–46)
Lymphs Abs: 1.5 10*3/uL (ref 0.7–4.0)
MCH: 26.9 pg (ref 26.0–34.0)
MCHC: 32 g/dL (ref 30.0–36.0)
MCV: 84.1 fL (ref 78.0–100.0)
MONOS PCT: 6 % (ref 3–12)
MPV: 9.4 fL (ref 8.6–12.4)
Monocytes Absolute: 0.2 10*3/uL (ref 0.1–1.0)
NEUTROS PCT: 46 % (ref 43–77)
Neutro Abs: 1.5 10*3/uL — ABNORMAL LOW (ref 1.7–7.7)
Platelets: 208 10*3/uL (ref 150–400)
RBC: 5.35 MIL/uL (ref 4.22–5.81)
RDW: 13.4 % (ref 11.5–15.5)
WBC: 3.2 10*3/uL — AB (ref 4.0–10.5)

## 2015-08-22 LAB — COMPLETE METABOLIC PANEL WITH GFR
ALT: 20 U/L (ref 9–46)
AST: 18 U/L (ref 10–40)
Albumin: 4.4 g/dL (ref 3.6–5.1)
Alkaline Phosphatase: 46 U/L (ref 40–115)
BILIRUBIN TOTAL: 0.4 mg/dL (ref 0.2–1.2)
BUN: 11 mg/dL (ref 7–25)
CO2: 26 mmol/L (ref 20–31)
CREATININE: 0.3 mg/dL — AB (ref 0.60–1.35)
Calcium: 9.1 mg/dL (ref 8.6–10.3)
Chloride: 106 mmol/L (ref 98–110)
GFR, Est African American: >89 mL/min (ref >=60)
GFR, Est Non African American: >89 mL/min (ref >=60)
GLUCOSE: 86 mg/dL (ref 65–99)
Potassium: 4.3 mmol/L (ref 3.5–5.3)
Sodium: 140 mmol/L (ref 135–146)
TOTAL PROTEIN: 7 g/dL (ref 6.1–8.1)

## 2015-08-22 LAB — LIPID PANEL
Cholesterol: 159 mg/dL (ref 125–200)
HDL: 53 mg/dL (ref >=40)
LDL CALC: 99 mg/dL (ref <130)
TRIGLYCERIDES: 34 mg/dL (ref <150)
Total CHOL/HDL Ratio: 3 Ratio (ref <=5.0)
VLDL: 7 mg/dL (ref <30)

## 2015-08-22 LAB — HEMOGLOBIN A1C
HEMOGLOBIN A1C: 5.3 % (ref <5.7)
MEAN PLASMA GLUCOSE: 105 mg/dL (ref <117)

## 2015-08-22 LAB — TSH: TSH: 2.002 u[IU]/mL (ref 0.350–4.500)

## 2015-08-22 NOTE — Progress Notes (Signed)
Patient here for his physical and fasting blood work

## 2015-08-22 NOTE — Progress Notes (Signed)
Patient ID: Wayne Nichols, male   DOB: July 10, 1991, 24 y.o.   MRN: [id number]  CC: form completion, physical   HPI: Wayne Nichols is a 24 y.o. male here today for a follow up visit.  Patient has past medical history of muscular atrophy and asthma. Patient reports that he has a job at [employer] and needs to have fasting labs completed for a insurance form. He takes only PRN medication and has no change in medical history. He is limited to a wheelchair and has very limited use of his BLE. He has no complaints today.   Allergies  Allergen Reactions  . Penicillins Hives   Past Medical History  Diagnosis Date  . Spinal muscular atrophy (HCC)   . Seasonal allergies   . Hemorrhoids    Current Outpatient Prescriptions on File Prior to Visit  Medication Sig Dispense Refill  . albuterol (PROVENTIL HFA;VENTOLIN HFA) 108 (90 BASE) MCG/ACT inhaler Inhale 2 puffs into the lungs every 6 (six) hours as needed for wheezing. 1 Inhaler 3  . fluticasone (FLONASE) 50 MCG/ACT nasal spray Place 2 sprays into both nostrils daily. 16 g 6  . ibuprofen (ADVIL,MOTRIN) 200 MG tablet Take 200-400 mg by mouth every 6 (six) hours as needed for moderate pain.     Marland Kitchen. loratadine (CLARITIN) 10 MG tablet Take 1 tablet (10 mg total) by mouth daily as needed for allergies. 30 tablet 1  . naproxen sodium (ANAPROX) 220 MG tablet Take 220-440 mg by mouth daily as needed (pain).    . phenylephrine-shark liver oil-mineral oil-petrolatum (PREPARATION H) 0.25-3-14-71.9 % rectal ointment Place 1 application rectally 2 (two) times daily as needed for hemorrhoids.    . pseudoephedrine (SUDAFED) 60 MG tablet Take 1 tablet (60 mg total) by mouth every 4 (four) hours as needed for congestion. (Patient not taking: Reported on 12/10/2014) 30 tablet 0   No current facility-administered medications on file prior to visit.   Family History  Problem Relation Age of Onset  . Stroke Maternal Grandmother   . Heart disease Maternal Grandmother     Social History   Social History  . Marital Status: Single    Spouse Name: N/A  . Number of Children: N/A  . Years of Education: N/A   Occupational History  . Not on file.   Social History Main Topics  . Smoking status: Never Smoker   . Smokeless tobacco: Not on file  . Alcohol Use: No  . Drug Use: No  . Sexual Activity: Not on file   Other Topics Concern  . Not on file   Social History Narrative    Review of Systems: Constitutional: Negative for fever, chills, diaphoresis, activity change, appetite change and fatigue. HENT: Negative for ear pain, nosebleeds, congestion, facial swelling, rhinorrhea, neck pain, neck stiffness and ear discharge.  Eyes: Negative for pain, discharge, redness, itching and visual disturbance. Respiratory: Negative for cough, choking, chest tightness, shortness of breath, wheezing and stridor.  Cardiovascular: Negative for chest pain, palpitations and leg swelling. Gastrointestinal: Negative for abdominal distention. Genitourinary: Negative for dysuria, urgency, frequency, hematuria, flank pain, decreased urine volume, difficulty urinating and dyspareunia.  Musculoskeletal: Negative for back pain, joint swelling, arthralgias and gait problem. Neurological: Negative for dizziness, tremors, seizures, syncope, facial asymmetry, speech difficulty, weakness, light-headedness, numbness and headaches.  Hematological: Negative for adenopathy. Does not bruise/bleed easily. Psychiatric/Behavioral: Negative for hallucinations, behavioral problems, confusion, dysphoric mood, decreased concentration and agitation.    Objective:   Filed Vitals:   08/22/15 [id number]  BP: 116/76  Pulse: 85  Temp: 98 F (36.7 C)  Resp: 16    Physical Exam: Constitutional: Patient appears well-developed and well-nourished. No distress. HENT: Normocephalic, atraumatic, External right and left ear normal. Oropharynx is clear and moist.  Eyes: Conjunctivae and EOM are normal.  PERRLA, no scleral icterus. Neck: Normal ROM. Neck supple. No JVD. No tracheal deviation. No thyromegaly. CVS: RRR, S1/S2 +, no murmurs, no gallops, no carotid bruit.  Pulmonary: Effort and breath sounds normal, no stridor, rhonchi, wheezes, rales.  Abdominal: Soft. BS +,  no distension, tenderness, rebound or guarding.  Musculoskeletal: stiff contracture of BLE with very limited mobility. Patient  Only able to flex feet bilaterally.  Lymphadenopathy: No lymphadenopathy noted, cervical, inguinal or axillary Neuro: Alert. Normal reflexes, muscle tone coordination. No cranial nerve deficit. Skin: Skin is warm and dry. No rash noted. Not diaphoretic. No erythema. No pallor. Psychiatric: Normal mood and affect. Behavior, judgment, thought content normal.  No results found for: WBC, HGB, HCT, MCV, PLT No results found for: CREATININE, BUN, NA, K, CL, CO2  No results found for: HGBA1C Lipid Panel  No results found for: CHOL, TRIG, HDL, CHOLHDL, VLDL, LDLCALC     Assessment and plan:   [name] was seen today for annual exam.  Diagnoses and all orders for this visit:  Annual physical exam -     Hemoglobin A1c -     Lipid panel -     CBC with Differential -     COMPLETE METABOLIC PANEL WITH GFR -     TSH -     Vitamin D, 25-hydroxy Forms completed, will fax once lab test return.   Refused influenza vaccine Went over benefits of vaccination with patient.   Return if symptoms worsen or fail to improve.        Ambrose Finland, NP-C [facility] [phone] 08/22/2015, 9:23 AM

## 2015-08-23 LAB — VITAMIN D 25 HYDROXY (VIT D DEFICIENCY, FRACTURES): Vit D, 25-Hydroxy: 22 ng/mL — ABNORMAL LOW (ref 30–100)

## 2015-09-13 ENCOUNTER — Telehealth: Payer: Self-pay

## 2015-09-13 NOTE — Telephone Encounter (Signed)
-----   Message from Ambrose FinlandValerie A Keck, NP sent at 09/12/2015  5:15 PM EST ----- Vitamin D low. He can get some OTC Vitamin D 1,000 IU to take daily. This will help with bone health. All other labs were normal. DId he get the copy of the paperwork I mailed to him?

## 2015-09-13 NOTE — Telephone Encounter (Signed)
Spoke with patient and he is aware of his lab results Patient as well as his company have received the paperwork

## 2015-11-09 ENCOUNTER — Other Ambulatory Visit: Payer: Self-pay | Admitting: Internal Medicine

## 2015-11-14 ENCOUNTER — Ambulatory Visit: Payer: Medicaid Other | Admitting: Internal Medicine

## 2015-11-17 ENCOUNTER — Telehealth: Payer: Self-pay | Admitting: Internal Medicine

## 2015-11-17 NOTE — Telephone Encounter (Signed)
Darlene from [facility] called requesting a hoyer lift for the patient. Pt. Is unable to bare weight. Please f/u

## 2015-11-18 ENCOUNTER — Telehealth: Payer: Self-pay

## 2015-11-18 DIAGNOSIS — M6250 Muscle wasting and atrophy, not elsewhere classified, unspecified site: Secondary | ICD-10-CM

## 2015-11-18 NOTE — Telephone Encounter (Signed)
Returned call to [employer] at [place] care agency Left message with receptionist to return our call

## 2015-11-18 NOTE — Telephone Encounter (Signed)
Returned call to [employer] at [facility] Requesting an rx for [facility] lift And mail to loving care agency [id number] [place] [address]

## 2015-11-18 NOTE — Telephone Encounter (Signed)
Darlene from [facility] returned call. Please f/u

## 2015-12-05 ENCOUNTER — Ambulatory Visit (INDEPENDENT_AMBULATORY_CARE_PROVIDER_SITE_OTHER): Payer: Medicaid Other | Admitting: Sports Medicine

## 2015-12-05 ENCOUNTER — Encounter: Payer: Self-pay | Admitting: Sports Medicine

## 2015-12-05 DIAGNOSIS — B353 Tinea pedis: Secondary | ICD-10-CM

## 2015-12-05 MED ORDER — CLOTRIMAZOLE 1 % EX SOLN: 1.0000 "application " | Freq: Two times a day (BID) | CUTANEOUS | Status: DC

## 2015-12-05 NOTE — Telephone Encounter (Signed)
Pt. Came in to drop SCAT application for PCP to fil out. Paperwork will be put on PCP box.

## 2015-12-05 NOTE — Progress Notes (Signed)
Patient ID: Wayne Nichols, male   DOB: 08/17/1991, 25 y.o.   MRN: [id number] Subjective: Wayne Nichols is a 25 y.o. male patient seen today in office with complaint of white skin in between 4-5 toes on right foot. States that it use to be painful but no longer is. Patient denies history of Diabetes, Neuropathy, or Vascular disease. Patient has no other pedal complaints at this time.   Patient is in wheelchair secondary to spinal muscular atrophy.  Patient Active Problem List   Diagnosis Date Noted  . Muscular atrophy, spinal (HCC) 03/01/2015  . Spinal muscular atrophy (HCC) 10/08/2011   Current Outpatient Prescriptions on File Prior to Visit  Medication Sig Dispense Refill  . albuterol (PROVENTIL HFA;VENTOLIN HFA) 108 (90 BASE) MCG/ACT inhaler Inhale 2 puffs into the lungs every 6 (six) hours as needed for wheezing. 1 Inhaler 3  . fluticasone (FLONASE) 50 MCG/ACT nasal spray Place 2 sprays into both nostrils daily. 16 g 6  . ibuprofen (ADVIL,MOTRIN) 200 MG tablet Take 200-400 mg by mouth every 6 (six) hours as needed for moderate pain.     Marland Kitchen loratadine (CLARITIN) 10 MG tablet TAKE 1 TABLET BY MOUTH EVERY DAY AS NEEDED FOR ALLERGIES 30 tablet 2  . naproxen sodium (ANAPROX) 220 MG tablet Take 220-440 mg by mouth daily as needed (pain).    . phenylephrine-shark liver oil-mineral oil-petrolatum (PREPARATION H) 0.25-3-14-71.9 % rectal ointment Place 1 application rectally 2 (two) times daily as needed for hemorrhoids.    . pseudoephedrine (SUDAFED) 60 MG tablet Take 1 tablet (60 mg total) by mouth every 4 (four) hours as needed for congestion. 30 tablet 0   No current facility-administered medications on file prior to visit.   Allergies  Allergen Reactions  . Amoxicillin Hives  . Penicillins Hives and Rash     Objective: Physical Exam  General: Well developed, nourished, no acute distress, awake, alert and oriented x 3  Vascular: Dorsalis pedis artery 2/4 bilateral, Posterior  tibial artery 2/4 bilateral, skin temperature warm to warm proximal to distal bilateral lower extremities, no varicosities, pedal hair present bilateral.  Neurological: Gross sensation present via light touch bilateral.   Dermatological: Skin is warm, dry, and supple bilateral, Nails 1-10 are short minimally dystrophic, + interdigital maceration at 4th webspace, right foot, no open lesions present bilateral, no callus/corns/hyperkeratotic tissue present bilateral. No other signs of infection bilateral.  Musculoskeletal: No boney deformities noted bilateral. Muscular strength within 3/5 bilateral without pain on range of motion. No pain with calf compression bilateral.  Assessment and Plan:  Problem List Items Addressed This Visit    None    Visit Diagnoses    Tinea pedis, right    -  Primary    Interdigital, 4th webspace    Relevant Medications    clotrimazole (LOTRIMIN) 1 % external solution      -Examined patient.  -Discussed treatment options for tinea pedis -Rx Clotrimazole solution to use 2x daily at 4th webspace on right -Recommended change shoes and socks and to practice good hygiene. Advised vinegar soaks and to try well in between toes. -Patient to return as needed for follow up evaluation or sooner if symptoms worsen.  Asencion Islam, DPM

## 2015-12-05 NOTE — Patient Instructions (Signed)
Soak with white distill vinegar 1 cup to 8 cups of water 2x daily Keep feet dry use foot powder as necessary Change socks and shoes frequently; use Lysol spray as needed

## 2015-12-05 NOTE — Progress Notes (Deleted)
   Subjective:    Patient ID: Wayne Nichols, male    DOB: [birth year], 25 y.o.   MRN: [id number]  HPI    Review of Systems  All other systems reviewed and are negative.      Objective:   Physical Exam        Assessment & Plan:

## 2015-12-12 NOTE — Telephone Encounter (Signed)
Pt. Called to check on the status of his SCAT paperwork. Please f/u

## 2015-12-19 ENCOUNTER — Telehealth: Payer: Self-pay | Admitting: Sports Medicine

## 2015-12-19 NOTE — Telephone Encounter (Signed)
Pt states the prescription from Dr. Marylene Land on 12/05/2015 has not been called to the pharmacy.  I reviewed the pharmacy orders and it was confirmed received on 12/05/2015 at 151pm, I told pt this and told him I would call in again.  Orders called in verbally to [phone] at 324pm.

## 2015-12-19 NOTE — Telephone Encounter (Signed)
Patient came in today and stated that his rx that was prescribed by Dr Marylene Land on 12/05/2015 still states that it has not been sent to his pharmacy. Please advise

## 2015-12-20 ENCOUNTER — Telehealth: Payer: Self-pay

## 2015-12-20 NOTE — Telephone Encounter (Signed)
Tried to contact patient to pick up his paper work Patient not available Message left on voice mail to return our call

## 2016-02-27 ENCOUNTER — Encounter (HOSPITAL_COMMUNITY): Payer: Self-pay | Admitting: *Deleted

## 2016-02-27 ENCOUNTER — Ambulatory Visit (HOSPITAL_COMMUNITY)
Admission: EM | Admit: 2016-02-27 | Discharge: 2016-02-27 | Disposition: A | Discharge status: Home / Self Care | Payer: Medicaid Other | Attending: Family Medicine | Admitting: Family Medicine

## 2016-02-27 DIAGNOSIS — Z880 Allergy status to penicillin: Secondary | ICD-10-CM | POA: Insufficient documentation

## 2016-02-27 DIAGNOSIS — J029 Acute pharyngitis, unspecified: Secondary | ICD-10-CM

## 2016-02-27 DIAGNOSIS — H672 Otitis media in diseases classified elsewhere, left ear: Secondary | ICD-10-CM | POA: Diagnosis not present

## 2016-02-27 DIAGNOSIS — G129 Spinal muscular atrophy, unspecified: Secondary | ICD-10-CM | POA: Diagnosis not present

## 2016-02-27 DIAGNOSIS — H6692 Otitis media, unspecified, left ear: Secondary | ICD-10-CM | POA: Insufficient documentation

## 2016-02-27 DIAGNOSIS — Z79899 Other long term (current) drug therapy: Secondary | ICD-10-CM | POA: Insufficient documentation

## 2016-02-27 LAB — POCT RAPID STREP A: STREPTOCOCCUS, GROUP A SCREEN (DIRECT): NEGATIVE

## 2016-02-27 MED ORDER — LIDOCAINE VISCOUS 2 % MT SOLN: 20.0000 mL | OROMUCOSAL | Status: DC | PRN

## 2016-02-27 MED ORDER — CLINDAMYCIN HCL 150 MG PO CAPS: 150.0000 mg | ORAL_CAPSULE | Freq: Four times a day (QID) | ORAL | Status: DC

## 2016-02-27 NOTE — ED Provider Notes (Addendum)
CSN: [id number]     Arrival date & time 02/27/16  1517 History   None    Chief Complaint  Patient presents with  . Sore Throat   (Consider location/radiation/quality/duration/timing/severity/associated sxs/prior Treatment) HPI Comments: Patient is here with c/o sore throat for several days and left ear pain.   Patient is a 25 y.o. male presenting with pharyngitis. The history is provided by the patient.  Sore Throat This is a new problem. The current episode started more than 2 days ago. The problem occurs constantly. The problem has been gradually worsening. Nothing aggravates the symptoms. Nothing relieves the symptoms. He has tried acetaminophen for the symptoms.    Past Medical History  Diagnosis Date  . Spinal muscular atrophy (HCC)   . Seasonal allergies   . Hemorrhoids    Past Surgical History  Procedure Laterality Date  . Back surgery    . Muscle biopsy    . Tendon release     Family History  Problem Relation Age of Onset  . Stroke Maternal Grandmother   . Heart disease Maternal Grandmother    Social History  Substance Use Topics  . Smoking status: Never Smoker   . Smokeless tobacco: Never Used  . Alcohol Use: No    Review of Systems  Constitutional: Positive for fever.  HENT: Positive for ear pain and sore throat.   Eyes: Negative.   Respiratory: Negative.   Cardiovascular: Negative.   Gastrointestinal: Negative.   Endocrine: Negative.   Genitourinary: Negative.   Musculoskeletal: Negative.   Skin: Negative.   Allergic/Immunologic: Negative.   Neurological: Negative.   Hematological: Negative.   Psychiatric/Behavioral: Negative.     Allergies  Amoxicillin and Penicillins  Home Medications   Prior to Admission medications   Medication Sig Start Date End Date Taking? Authorizing Provider  albuterol (PROVENTIL HFA;VENTOLIN HFA) 108 (90 BASE) MCG/ACT inhaler Inhale 2 puffs into the lungs every 6 (six) hours as needed for wheezing. 08/15/15   Ambrose FinlandValerie  A Keck, NP  benzoyl peroxide 10 % gel Apply topically.    Historical Provider, MD  clindamycin (CLEOCIN T) 1 % external solution Apply topically.    Historical Provider, MD  clotrimazole (LOTRIMIN) 1 % external solution Apply 1 application topically 2 (two) times daily. Use in between toes on right foot 12/05/15   Asencion Islamitorya Stover, DPM  fluticasone (FLONASE) 50 MCG/ACT nasal spray Place 2 sprays into both nostrils daily. 02/18/15   Ambrose FinlandValerie A Keck, NP  ibuprofen (ADVIL,MOTRIN) 200 MG tablet Take 200-400 mg by mouth every 6 (six) hours as needed for moderate pain.     Historical Provider, MD  loratadine (CLARITIN) 10 MG tablet TAKE 1 TABLET BY MOUTH EVERY DAY AS NEEDED FOR ALLERGIES 11/09/15   Ambrose FinlandValerie A Keck, NP  naproxen sodium (ANAPROX) 220 MG tablet Take 220-440 mg by mouth daily as needed (pain).    Historical Provider, MD  phenylephrine-shark liver oil-mineral oil-petrolatum (PREPARATION H) 0.25-3-14-71.9 % rectal ointment Place 1 application rectally 2 (two) times daily as needed for hemorrhoids.    Historical Provider, MD  pseudoephedrine (SUDAFED) 60 MG tablet Take 1 tablet (60 mg total) by mouth every 4 (four) hours as needed for congestion. 07/15/13   Junious SilkHannah Merrell, PA-C  tretinoin (RETIN-A) 0.025 % cream Apply topically at bedtime.    Historical Provider, MD   Meds Ordered and Administered this Visit  Medications - No data to display  BP 128/79 mmHg  Pulse 101  Temp(Src) 99.2 F (37.3 C) (Oral)  Resp 16  SpO2 98% No data found.   Physical Exam  Constitutional: He is oriented to person, place, and time. He appears well-developed and well-nourished.  HENT:  Head: Normocephalic and atraumatic.  Right Ear: External ear normal.  Left TM injected and dull.  OPX - injected and tonsils 2 plus with exudates  Eyes: Conjunctivae are normal. Pupils are equal, round, and reactive to light.  Neck: Normal range of motion. Neck supple.  Cardiovascular: Normal rate and normal heart sounds.    Pulmonary/Chest: Effort normal and breath sounds normal.  Abdominal: Soft. Bowel sounds are normal.  Musculoskeletal: Normal range of motion.  Lymphadenopathy:    He has cervical adenopathy.  Neurological: He is alert and oriented to person, place, and time.    ED Course  Procedures (including critical care time)  Labs Review Labs Reviewed - No data to display  Imaging Review No results found.   Visual Acuity Review  Right Eye Distance:   Left Eye Distance:   Bilateral Distance:    Right Eye Near:   Left Eye Near:    Bilateral Near:         MDM  Pharyngitis - clindamycin  po q 6 hours #28 Warm salt water gargles, Push po fluids, rest, tylenol and motrin otc prn as directed for fever, arthralgias, and myalgias.  Follow up prn if sx's continue or persist.  Left otitis media - clindamycin  one po q 6 hours x 7 days #28     Deatra Canter, FNP 02/27/16 1855  Deatra Canter, FNP 02/27/16 2207

## 2016-02-27 NOTE — ED Notes (Signed)
sorethroat     X  sev  Days    Pt  Also  Reports  Irritated  l  Ear          Pt   Is   Wheelchair  Bound         Yet  Is  Awake  And  Alert  And  Oriented

## 2016-03-01 LAB — CULTURE, GROUP A STREP (THRC)

## 2016-04-05 ENCOUNTER — Other Ambulatory Visit: Payer: Self-pay | Admitting: Internal Medicine

## 2016-04-09 ENCOUNTER — Encounter (HOSPITAL_COMMUNITY): Payer: Self-pay | Admitting: Emergency Medicine

## 2016-04-09 ENCOUNTER — Ambulatory Visit (HOSPITAL_COMMUNITY)
Admission: EM | Admit: 2016-04-09 | Discharge: 2016-04-09 | Disposition: A | Discharge status: Home / Self Care | Payer: Medicaid Other | Attending: Family Medicine | Admitting: Family Medicine

## 2016-04-09 ENCOUNTER — Ambulatory Visit (INDEPENDENT_AMBULATORY_CARE_PROVIDER_SITE_OTHER): Payer: Medicaid Other

## 2016-04-09 DIAGNOSIS — R0989 Other specified symptoms and signs involving the circulatory and respiratory systems: Secondary | ICD-10-CM | POA: Diagnosis not present

## 2016-04-09 NOTE — ED Provider Notes (Signed)
CSN: [id number]     Arrival date & time 04/09/16  1649 History   First MD Initiated Contact with Patient 04/09/16 2002     Chief Complaint  Patient presents with  . Oral Swelling   (Consider location/radiation/quality/duration/timing/severity/associated sxs/prior Treatment) HPI Comments: 25 year old male with a history of spinal muscular dystrophy presents to the urgent care stating that he has a foreign body sensation in his throat starting 4 days ago. He is able to breathe well, swallowed eat and drink. He is able to handle his secretions. Denies choking or coughing or having any trouble breathing. He came to the urgent care today because it felt more prominent to him.   Past Medical History  Diagnosis Date  . Spinal muscular atrophy (HCC)   . Seasonal allergies   . Hemorrhoids    Past Surgical History  Procedure Laterality Date  . Back surgery    . Muscle biopsy    . Tendon release     Family History  Problem Relation Age of Onset  . Stroke Maternal Grandmother   . Heart disease Maternal Grandmother    Social History  Substance Use Topics  . Smoking status: Never Smoker   . Smokeless tobacco: Never Used  . Alcohol Use: No    Review of Systems  Constitutional: Negative for fever, activity change, appetite change and fatigue.  HENT: Negative for congestion, ear pain, facial swelling, sore throat and trouble swallowing.   Respiratory: Negative.  Negative for cough, choking, shortness of breath, wheezing and stridor.   Cardiovascular: Negative.   Skin: Negative.   All other systems reviewed and are negative.   Allergies  Amoxicillin and Penicillins  Home Medications   Prior to Admission medications   Medication Sig Start Date End Date Taking? Authorizing Provider  benzoyl peroxide 10 % gel Apply topically.   Yes Historical Provider, MD  clindamycin (CLEOCIN T) 1 % external solution Apply topically.   Yes Historical Provider, MD  clindamycin (CLEOCIN) 150 MG capsule  Take 1 capsule (150 mg total) by mouth every 6 (six) hours. 02/27/16  Yes Deatra CanterWilliam J Oxford, FNP  loratadine (CLARITIN) 10 MG tablet TAKE 1 TABLET BY MOUTH EVERY DAY AS NEEDED FOR ALLERGIES 11/09/15  Yes Ambrose FinlandValerie A Keck, NP  Probiotic Product (PROBIOTIC ADVANCED PO) Take by mouth.   Yes Historical Provider, MD  albuterol (PROVENTIL HFA;VENTOLIN HFA) 108 (90 BASE) MCG/ACT inhaler Inhale 2 puffs into the lungs every 6 (six) hours as needed for wheezing. 08/15/15   Ambrose FinlandValerie A Keck, NP  clotrimazole (LOTRIMIN) 1 % external solution Apply 1 application topically 2 (two) times daily. Use in between toes on right foot 12/05/15   Asencion Islamitorya Stover, DPM  fluticasone (FLONASE) 50 MCG/ACT nasal spray Place 2 sprays into both nostrils daily. 02/18/15   Ambrose FinlandValerie A Keck, NP  ibuprofen (ADVIL,MOTRIN) 200 MG tablet Take 200-400 mg by mouth every 6 (six) hours as needed for moderate pain.     Historical Provider, MD  lidocaine (XYLOCAINE) 2 % solution Use as directed 20 mLs in the mouth or throat as needed for mouth pain. 02/27/16   Deatra CanterWilliam J Oxford, FNP  naproxen sodium (ANAPROX) 220 MG tablet Take 220-440 mg by mouth daily as needed (pain).    Historical Provider, MD  phenylephrine-shark liver oil-mineral oil-petrolatum (PREPARATION H) 0.25-3-14-71.9 % rectal ointment Place 1 application rectally 2 (two) times daily as needed for hemorrhoids.    Historical Provider, MD  pseudoephedrine (SUDAFED) 60 MG tablet Take 1 tablet (60 mg total) by mouth  every 4 (four) hours as needed for congestion. 07/15/13   Junious Silk, PA-C  tretinoin (RETIN-A) 0.025 % cream Apply topically at bedtime.    Historical Provider, MD   Meds Ordered and Administered this Visit  Medications - No data to display  BP 115/76 mmHg  Pulse 72  Temp(Src) 98.5 F (36.9 C) (Oral)  Resp 18  SpO2 100% No data found.   Physical Exam  Constitutional: He is oriented to person, place, and time. He appears well-developed and well-nourished. No distress.   Speech is articulate and clear.  HENT:  Unable to visualize any of the oropharynx. Patient is unable to open his mouth wide enough and his time is large and stiff. He is unable to relax the tongue or place it in a position amenable for visualization of the oropharynx. There is no intraoral swelling.  Neck: Neck supple. No tracheal deviation present.  Palpation of the neck reveals no lymphadenopathy, tracheal deviation, abnormal nodules or swelling. Swallowing reflex intact.  Cardiovascular: Normal rate.   Pulmonary/Chest: Effort normal and breath sounds normal.  No abnormal airway sounds. Air moves freely through the upper respiratory tract.  Neurological: He is alert and oriented to person, place, and time. He exhibits normal muscle tone.  Skin: Skin is warm and dry.  Nursing note and vitals reviewed.   ED Course  Procedures (including critical care time)  Labs Review Labs Reviewed - No data to display  Imaging Review Dg Neck Soft Tissue  04/09/2016  CLINICAL DATA:  Pt states he feels like something is caught in his throat, says he can eat but it feels like something is in there PT IN A WHEELCHAIR VIEWS DONE ON THE TABLE, CHEST BOARD NOT WORKING EXAM: NECK SOFT TISSUES - 1+ VIEW COMPARISON:  None. FINDINGS: There is no evidence of retropharyngeal soft tissue swelling or epiglottic enlargement. The cervical airway is unremarkable and no radio-opaque foreign body identified. Thoracic fixation hardware is partially visualized. IMPRESSION: Negative. Electronically Signed   By: Corlis Leak M.D.   On: 04/09/2016 20:45     Visual Acuity Review  Right Eye Distance:   Left Eye Distance:   Bilateral Distance:    Right Eye Near:   Left Eye Near:    Bilateral Near:         MDM   1. Sensation of foreign body in throat    The x-ray does not indicate any foreign body, swelling, obstruction, mass effect or other abnormalities. Since you are able to breathe well and without choking or  coughing and are able to swallow without difficulty it does not appear necessary for you to go to the urgent department at this time. If you have any worsening such as choking, coughing or gagging or trouble breathing or swallowing he would need to go to the emergency department. Otherwise call the ear nose and throat specialist tomorrow for an appointment. See page one for the telephone number.     Hayden Rasmussen, NP 04/09/16 2129

## 2016-04-09 NOTE — Discharge Instructions (Signed)
The x-ray does not indicate any foreign body, swelling, obstruction, mass effect or other abnormalities. Since you are able to breathe well and without choking or coughing and are able to swallow without difficulty it does not appear necessary for you to go to the urgent department at this time. If you have any worsening such as choking, coughing or gagging or trouble breathing or swallowing he would need to go to the emergency department. Otherwise call the ear nose and throat specialist tomorrow for an appointment. See page one for the telephone number.

## 2016-04-09 NOTE — ED Notes (Signed)
[facility] a complaint of a feeling of swelling in his throat that he described as "feeling like something is stuck in his throat" as well as warm sensation that moves down his throat.

## 2016-04-10 ENCOUNTER — Telehealth: Payer: Self-pay | Admitting: General Practice

## 2016-04-10 NOTE — Telephone Encounter (Signed)
Pt. Called requesting to be referred out to the ENT b/c he is having  Some issues with his throat. Please f/u with pt.

## 2016-04-12 NOTE — Telephone Encounter (Signed)
Patient should be scheduled with PCP for assessment prior to being referred out.

## 2016-05-01 ENCOUNTER — Ambulatory Visit: Payer: Medicaid Other | Attending: Family Medicine | Admitting: Family Medicine

## 2016-05-01 ENCOUNTER — Encounter: Payer: Self-pay | Admitting: Family Medicine

## 2016-05-01 VITALS — BP 108/75 | HR 81 | Temp 98.1°F | Wt 117.2 lb

## 2016-05-01 DIAGNOSIS — Z880 Allergy status to penicillin: Secondary | ICD-10-CM | POA: Diagnosis not present

## 2016-05-01 DIAGNOSIS — G129 Spinal muscular atrophy, unspecified: Secondary | ICD-10-CM

## 2016-05-01 DIAGNOSIS — Z9889 Other specified postprocedural states: Secondary | ICD-10-CM | POA: Diagnosis not present

## 2016-05-01 DIAGNOSIS — F418 Other specified anxiety disorders: Secondary | ICD-10-CM | POA: Diagnosis not present

## 2016-05-01 DIAGNOSIS — Z79899 Other long term (current) drug therapy: Secondary | ICD-10-CM | POA: Diagnosis not present

## 2016-05-01 DIAGNOSIS — F419 Anxiety disorder, unspecified: Secondary | ICD-10-CM | POA: Diagnosis not present

## 2016-05-01 DIAGNOSIS — F329 Major depressive disorder, single episode, unspecified: Secondary | ICD-10-CM | POA: Insufficient documentation

## 2016-05-01 DIAGNOSIS — L700 Acne vulgaris: Secondary | ICD-10-CM

## 2016-05-01 DIAGNOSIS — Z13228 Encounter for screening for other metabolic disorders: Secondary | ICD-10-CM

## 2016-05-01 DIAGNOSIS — L709 Acne, unspecified: Secondary | ICD-10-CM | POA: Insufficient documentation

## 2016-05-01 DIAGNOSIS — F32A Depression, unspecified: Secondary | ICD-10-CM | POA: Insufficient documentation

## 2016-05-01 MED ORDER — FLUOXETINE HCL 20 MG PO TABS: 20.0000 mg | ORAL_TABLET | Freq: Every day | ORAL | Status: DC

## 2016-05-01 NOTE — Progress Notes (Signed)
Subjective:  Patient ID: Wayne Nichols, male    DOB: 05-01-1991  Age: 25 y.o. MRN: [id number]  CC: Establish Care   HPI [name] is a 25 year old male with a history of spinal muscular atrophy, acne vulgaris who presents to the clinic to establish care with me as he was previously followed by the practitioner who is no longer in practice. His acne is managed by dermatology at [facility] where he receives his tretinoin and clindamycin.  He endorses intermittent anxiety and depression but denies suicidal ideations or intents; does have occasions of anhedonia but this is unrelated to his medical condition. Would like to be placed on an antidepressant.  He denies pain in his muscles or joints but endorses stiffness of his extremities and he is wheelchair bound.  Past Medical History  Diagnosis Date  . Spinal muscular atrophy (HCC)   . Seasonal allergies   . Hemorrhoids     Past Surgical History  Procedure Laterality Date  . Back surgery    . Muscle biopsy    . Tendon release      Allergies  Allergen Reactions  . Amoxicillin Hives  . Penicillins Hives and Rash     Outpatient Prescriptions Prior to Visit  Medication Sig Dispense Refill  . albuterol (PROVENTIL HFA;VENTOLIN HFA) 108 (90 BASE) MCG/ACT inhaler Inhale 2 puffs into the lungs every 6 (six) hours as needed for wheezing. 1 Inhaler 3  . benzoyl peroxide 10 % gel Apply topically.    . clindamycin (CLEOCIN T) 1 % external solution Apply topically.    . clotrimazole (LOTRIMIN) 1 % external solution Apply 1 application topically 2 (two) times daily. Use in between toes on right foot 60 mL 1  . fluticasone (FLONASE) 50 MCG/ACT nasal spray Place 2 sprays into both nostrils daily. 16 g 6  . ibuprofen (ADVIL,MOTRIN) 200 MG tablet Take 200-400 mg by mouth every 6 (six) hours as needed for moderate pain.     Marland Kitchen loratadine (CLARITIN) 10 MG tablet TAKE 1 TABLET BY MOUTH EVERY DAY AS NEEDED FOR ALLERGIES 30  tablet 2  . tretinoin (RETIN-A) 0.025 % cream Apply topically at bedtime.    . phenylephrine-shark liver oil-mineral oil-petrolatum (PREPARATION H) 0.25-3-14-71.9 % rectal ointment Place 1 application rectally 2 (two) times daily as needed for hemorrhoids. Reported on 05/01/2016    . clindamycin (CLEOCIN) 150 MG capsule Take 1 capsule (150 mg total) by mouth every 6 (six) hours. 28 capsule 0  . lidocaine (XYLOCAINE) 2 % solution Use as directed 20 mLs in the mouth or throat as needed for mouth pain. 100 mL 0  . naproxen sodium (ANAPROX) 220 MG tablet Take 220-440 mg by mouth daily as needed (pain).    . Probiotic Product (PROBIOTIC ADVANCED PO) Take by mouth.    . pseudoephedrine (SUDAFED) 60 MG tablet Take 1 tablet (60 mg total) by mouth every 4 (four) hours as needed for congestion. 30 tablet 0   No facility-administered medications prior to visit.    ROS Review of Systems  Constitutional: Negative for activity change and appetite change.  HENT: Negative for sinus pressure and sore throat.   Eyes: Negative for visual disturbance.  Respiratory: Negative for cough, chest tightness and shortness of breath.   Cardiovascular: Negative for chest pain and leg swelling.  Gastrointestinal: Negative for abdominal pain, diarrhea, constipation and abdominal distention.  Endocrine: Negative.   Genitourinary: Negative for dysuria.  Musculoskeletal:       See hpi  Skin: Negative for rash.  Allergic/Immunologic: Negative.   Neurological: Negative for weakness, light-headedness and numbness.  Psychiatric/Behavioral: Negative for suicidal ideas and dysphoric mood.    Objective:  BP 108/75 mmHg  Pulse 81  Temp(Src) 98.1 F (36.7 C) (Oral)  Wt 117 lb 3.2 oz (53.162 kg)  SpO2 98%  BP/Weight 05/01/2016 04/09/2016 02/27/2016  Systolic BP 108 115 128  Diastolic BP 75 76 79  Wt. (Lbs) 117.2 - -  BMI 20.11 - -      Physical Exam  Constitutional: He is oriented to person, place, and time. He  appears well-developed and well-nourished.  Cardiovascular: Normal rate, normal heart sounds and intact distal pulses.   No murmur heard. Pulmonary/Chest: Effort normal and breath sounds normal. He has no wheezes. He has no rales. He exhibits no tenderness.  Abdominal: Soft. Bowel sounds are normal. He exhibits no distension and no mass. There is no tenderness.  Musculoskeletal:  Spasticity of upper extremities with forward elevation limited to 75. Severely restricted range of motion of lower extremities  Neurological: He is alert and oriented to person, place, and time.     Assessment & Plan:   1. Muscular atrophy, spinal (HCC) - Ambulatory referral to Physical Therapy  2. Spinal muscular atrophy (HCC) - Ambulatory referral to Physical Therapy  3. Acne vulgaris Currently using tretinoin and clindamycin cream [facility]  4. Screening for metabolic disorder - COMPLETE METABOLIC PANEL WITH GFR - Lipid panel  5. Anxiety and depression Placed on Prozac.    No orders of the defined types were placed in this encounter.    Follow-up: Return in about 6 months (around 11/01/2016) for Coordination of care.   Jaclyn ShaggyEnobong Amao MD

## 2016-05-02 LAB — COMPLETE METABOLIC PANEL WITH GFR
ALT: 40 U/L (ref 9–46)
AST: 24 U/L (ref 10–40)
Albumin: 4.5 g/dL (ref 3.6–5.1)
Alkaline Phosphatase: 46 U/L (ref 40–115)
BUN: 12 mg/dL (ref 7–25)
CHLORIDE: 107 mmol/L (ref 98–110)
CO2: 23 mmol/L (ref 20–31)
Calcium: 9.3 mg/dL (ref 8.6–10.3)
Creat: 0.28 mg/dL — ABNORMAL LOW (ref 0.60–1.35)
GFR, Est Non African American: >89 mL/min (ref >=60)
Glucose, Bld: 76 mg/dL (ref 65–99)
Potassium: 4.2 mmol/L (ref 3.5–5.3)
Sodium: 139 mmol/L (ref 135–146)
Total Bilirubin: 0.6 mg/dL (ref 0.2–1.2)
Total Protein: 7.1 g/dL (ref 6.1–8.1)

## 2016-05-02 LAB — LIPID PANEL
Cholesterol: 171 mg/dL (ref 125–200)
HDL: 62 mg/dL (ref >=40)
LDL Cholesterol: 99 mg/dL (ref <130)
TRIGLYCERIDES: 51 mg/dL (ref <150)
Total CHOL/HDL Ratio: 2.8 Ratio (ref <=5.0)
VLDL: 10 mg/dL (ref <30)

## 2016-05-03 ENCOUNTER — Telehealth: Payer: Self-pay

## 2016-05-03 NOTE — Telephone Encounter (Signed)
Called patient. Gave lab results. Patient verbalized understanding.  

## 2016-05-03 NOTE — Telephone Encounter (Signed)
-----   Message from Jaclyn ShaggyEnobong Amao, MD sent at 05/02/2016  4:44 PM EDT ----- Please inform the patient that labs are normal. Thank you.

## 2016-05-10 ENCOUNTER — Ambulatory Visit: Payer: Medicaid Other | Admitting: Physical Therapy

## 2016-05-22 ENCOUNTER — Ambulatory Visit: Payer: Medicaid Other | Admitting: Physical Therapy

## 2016-05-23 ENCOUNTER — Other Ambulatory Visit: Payer: Self-pay | Admitting: *Deleted

## 2016-05-23 MED ORDER — LORATADINE 10 MG PO TABS: ORAL_TABLET | ORAL | 2 refills | Status: DC

## 2016-05-23 NOTE — Telephone Encounter (Signed)
Patient verified DOB Patient was last seen on 05/01/16. Patients Claritin was refilled to the [facility]. No further questions at this time.

## 2016-05-28 ENCOUNTER — Telehealth: Payer: Self-pay | Admitting: Family Medicine

## 2016-05-28 ENCOUNTER — Other Ambulatory Visit: Payer: Self-pay | Admitting: Pharmacist

## 2016-05-28 DIAGNOSIS — F329 Major depressive disorder, single episode, unspecified: Secondary | ICD-10-CM

## 2016-05-28 DIAGNOSIS — F32A Depression, unspecified: Secondary | ICD-10-CM

## 2016-05-28 DIAGNOSIS — F419 Anxiety disorder, unspecified: Principal | ICD-10-CM

## 2016-05-28 MED ORDER — FLUOXETINE HCL 20 MG PO TABS: 20.0000 mg | ORAL_TABLET | Freq: Every day | ORAL | 3 refills | Status: DC

## 2016-05-28 MED ORDER — FLUOXETINE HCL 20 MG PO CAPS: 20.0000 mg | ORAL_CAPSULE | Freq: Every day | ORAL | 3 refills | Status: DC

## 2016-05-28 NOTE — Telephone Encounter (Signed)
Patient needs prozac. Please follow up.

## 2016-05-28 NOTE — Telephone Encounter (Signed)
Patient should have refills at [facility] - refilled for now, patient needs to contact pharmacy for refills each month.

## 2016-05-29 ENCOUNTER — Ambulatory Visit: Payer: Medicaid Other | Admitting: Physical Therapy

## 2016-06-06 ENCOUNTER — Ambulatory Visit: Payer: Medicaid Other | Attending: Family Medicine | Admitting: Physical Therapy

## 2016-07-10 ENCOUNTER — Ambulatory Visit: Payer: Medicaid Other

## 2016-07-16 ENCOUNTER — Ambulatory Visit: Payer: Medicaid Other | Attending: Family Medicine

## 2016-07-16 DIAGNOSIS — M6281 Muscle weakness (generalized): Secondary | ICD-10-CM | POA: Diagnosis not present

## 2016-07-16 DIAGNOSIS — R2689 Other abnormalities of gait and mobility: Secondary | ICD-10-CM

## 2016-07-16 NOTE — Therapy (Signed)
[facility] [address] [place], [place], [address] Phone: [phone]   Fax:  [phone]  Physical Therapy Evaluation  Patient Details  Name: Wayne Nichols MRN: [id number] Date of Birth: 08-Nov-1990 Referring Provider: Dr. Venetia Night  Encounter Date: 07/16/2016      PT End of Session - 07/16/16 1643    Visit Number 1   Number of Visits 4   Date for PT Re-Evaluation 09/14/16   Authorization Type Medicaid-awaiting auth approval   PT Start Time 1532   PT Stop Time 1614   PT Time Calculation (min) 42 min   Equipment Utilized During Treatment Gait belt   Activity Tolerance Patient tolerated treatment well   Behavior During Therapy [facility] for tasks assessed/performed      Past Medical History:  Diagnosis Date  . Hemorrhoids   . Seasonal allergies   . Spinal muscular atrophy [facility])     Past Surgical History:  Procedure Laterality Date  . BACK SURGERY    . MUSCLE BIOPSY    . TENDON RELEASE      There were no vitals filed for this visit.       Subjective Assessment - 07/16/16 1539    Subjective Pt states he had PT a long time ago (stopped in high school), and he feels like his strength and balance/txfs has been getting worse. Pt was able to walk until approx. 6th grade but has utilized power w/c since that time.    Patient is accompained by: --  Roland-stepdad in lobby   Pertinent History Spinal muscular atrophy, depression and anxiety, hx of spinal rods (need to clarify exact location)   Patient Stated Goals Improve independence and get stronger.    Currently in Pain? Yes   Pain Score --  3-4/10   Pain Location Back   Pain Orientation --  where spinal rod implant is located   Pain Descriptors / Indicators Dull   Pain Type Chronic pain   Pain Onset More than a month ago   Pain Frequency Intermittent   Aggravating Factors  sitting up for a long time   Pain Relieving Factors lying down              [facility] PT Assessment - 07/16/16 1544      Assessment   Medical Diagnosis Spinal muscular atrophy (G12.9)   Referring Provider Dr. Venetia Night   Onset Date/Surgical Date 07/16/14   Hand Dominance Right   Prior Therapy in high school     Precautions   Precautions Fall     Restrictions   Weight Bearing Restrictions No     Balance Screen   Has the patient fallen in the past 6 months Yes   How many times? 1   Has the patient had a decrease in activity level because of a fear of falling?  No   Is the patient reluctant to leave their home because of a fear of falling?  No     Home Tourist information centre manager residence   Living Arrangements Parent  and brother   Available Help at Discharge Family   Type of Home Apartment   Home Access Ramped entrance   Home Layout One level   Home Equipment Wheelchair - power;Shower seat;Wheelchair - Public relations account executive      Prior Function   Level of Independence Independent with basic ADLs  occasional assist with bathroom txfs, bathing back    Vocation Full time employment   [employer]: Product/process development scientist, loans, lock/unlock doors, elevates chair and lean down) and works at [employer] mainly on [employer].   Leisure Play video games, watch sports, read, going out with friends     Cognition   Overall Cognitive Status Within Functional Limits for tasks assessed     Observation/Other Assessments   Focus on Therapeutic Outcomes (FOTO)  FOTO adjusted score: 44, expected goal: 53 to improve quality of life and reduce physical fear.     Sensation   Light Touch Appears Intact     Posture/Postural Control   Posture/Postural Control No significant limitations  spinal rods, need clarification on exact location     ROM / Strength   AROM / PROM / Strength AROM;Strength     AROM   Overall AROM  Deficits   Overall AROM Comments BUE shoulder flexion (approx. 90 degrees) limited 2/2 weakness. B hip flex, knee ext, knee flex,  ankle DF and PF AROM limited 2/2 weakness.     Strength   Overall Strength Deficits   Overall Strength Comments B hip flex: 1/5, knee ext: 2/5, knee flex: 2/5, ankle DF: 2-/5, ankle PF: 2/5. hip abd/add: 2/5.     Bed Mobility   Bed Mobility Rolling Right;Rolling Left   Rolling Right 4: Min guard   Rolling Left 4: Min guard     Transfers   Transfers Supine to Sit;Sit to Supine;Lateral/Scoot Transfers   Lateral/Scoot Transfers 2: Max assist;Other (comment)   Lateral/Scoot Transfer Details (indicate cue type and reason) +2 assist to ensure safety. Cues  and demo for head/hip relationship and for sequencing/proper technique.   Supine to Sit 2: Max assist;With upper extremity assist   Supine to Sit Details (indicate cue type and reason) Cues for sequencing and wt. shifting.   Supine to Sit Details Verbal cues for sequencing;Verbal cues for technique   Sit to Supine 2: Max assist;With upper extremity assist   Sit to Supine Details (indicate cue type and reason) Verbal cues for sequencing;Verbal cues for technique   Sit to Supine Details  Cues for sequencing and to guide trunk and BLEs into supine.     Ambulation/Gait   Ambulation/Gait No  pt has not amb. since 6th grade     Balance   Balance Assessed Yes     Static Sitting Balance   Static Sitting - Balance Support Feet supported;No upper extremity supported   Static Sitting - Level of Assistance 5: Stand by assistance   Static Sitting - Comment/# of Minutes Pt able to perform static sitting with S to ensure safety and no LOB.      Dynamic Sitting Balance   Dynamic Sitting - Balance Support Bilateral upper extremity supported;Feet supported   Dynamic Sitting - Level of Assistance 4: Min assist;Other (comment)  min guard   Dynamic Sitting balance - Comments Pt performed lateral wt. shifting onto forearms and then back to upright position, cues for technique. Pt also attempted lateral scoots but required min-mod A.                            PT Education - 07/16/16 1640    Education provided Yes   Education Details PT discussed frequency/duration. PT also educated pt on insurance process.    Person(s) Educated Patient   Methods Explanation   Comprehension Verbalized understanding          PT Short Term Goals - 07/16/16 1652      PT  SHORT TERM GOAL #1   Title Pt will be IND in HEP to improve strength, balance, and flexibility. TARGET DATE FOR ALL STGS: 08/13/16   Baseline No HEP   Time 4   Period Weeks   Status New     PT SHORT TERM GOAL #2   Title Pt will perform w/c<>mat with mod A in order to improve functional mobility.    Baseline Max A during w/c<>mat txfs   Time 4   Period Weeks   Status New     PT SHORT TERM GOAL #3   Title Pt will perform supine<>sit with mod A to improve functional mobility.    Baseline Max A during supine<>sit txfs   Time 4   Period Weeks   Status New           PT Long Term Goals - 07/16/16 1654      PT LONG TERM GOAL #1   Title Pt will be able to tolerate 5 minutes in the standing frame to improve bone density and improve skin integrity. TARGET DATE FOR ALL LTGS: 09/10/16   Baseline unable to tolerate any minutes in standing frame   Time 8   Period Weeks   Status New     PT LONG TERM GOAL #2   Title Pt will perform supine<>sit with min A to improve functional mobility.    Baseline Max A during supine<>sit txfs   Time 8   Period Weeks   Status New     PT LONG TERM GOAL #3   Title Pt will perform w/c<>mat with min A to improve functional mobility.    Baseline Max A during w/c<>mat txfs   Time 8   Period Weeks   Status New               Plan - 07/16/16 1644    Clinical Impression Statement Pt is a pleasant 24y/o male presenting to OPPT neuro with spinal muscular atrophy (G12.9). Pt's PMH significant for the following: depression, anxiety, and spinal rods. Pt presented with the following deficits: impaired  balance, impaired strength, decreased ROM, requires assist during all txfs including bed mobility, and decr. flexibility. Pt would benefit from skilled PT to improve safety and IND during functional mobility. Pt may also benefit from a w/c eval to assess manual w/c to allow pt to use manual w/c to improve endurance and strength vs. power w/c.    Rehab Potential Good   Clinical Impairments Affecting Rehab Potential co-morbidities   PT Frequency Other (comment)   PT Duration Other (comment)  Eval plus 3 visits over 8 weeks   PT Treatment/Interventions ADLs/Self Care Home Management;Biofeedback;DME Instruction;Functional mobility training;Therapeutic activities;Therapeutic exercise;Balance training;Neuromuscular re-education;Passive range of motion;Manual techniques;Orthotic Fit/Training;Patient/family education;Vestibular   PT Next Visit Plan Initiate flexibility, strengthening and balance HEP. W/c<>mat txfs and bed mobilty.   Consulted and Agree with Plan of Care Patient      Patient will benefit from skilled therapeutic intervention in order to improve the following deficits and impairments:  Decreased endurance, Decreased knowledge of use of DME, Decreased balance, Decreased mobility, Impaired flexibility, Postural dysfunction, Impaired UE functional use, Decreased strength  Visit Diagnosis: Muscle weakness (generalized) - Plan: PT plan of care cert/re-cert  Other abnormalities of gait and mobility - Plan: PT plan of care cert/re-cert     Problem List Patient Active Problem List   Diagnosis Date Noted  . Acne 05/01/2016  . Anxiety and depression 05/01/2016  . Muscular atrophy, spinal (HCC) 03/01/2015  .  Spinal muscular atrophy (HCC) 10/08/2011    [name] Frary L 07/16/2016, 5:01 PM  [facility] [address] [place], [place], [address] Phone: [phone]   Fax:  [phone]  Name: Wayne Nichols MRN:  [id number] Date of Birth: 1990/12/11  Zerita Boers, PT,DPT 07/16/16 5:01 PM Phone: [phone] Fax: [phone]

## 2016-08-02 ENCOUNTER — Ambulatory Visit: Payer: Medicaid Other | Attending: Family Medicine | Admitting: Physical Therapy

## 2016-08-02 DIAGNOSIS — M6281 Muscle weakness (generalized): Secondary | ICD-10-CM | POA: Diagnosis present

## 2016-08-02 DIAGNOSIS — R2689 Other abnormalities of gait and mobility: Secondary | ICD-10-CM | POA: Diagnosis present

## 2016-08-02 NOTE — Patient Instructions (Addendum)
Standing frame:  Works on:  Pharmacologisttretching/ strengthening legs and trunk, Weight bearing is good for bone strength Pressure relief  Leg strengthening: Glute squeezes Rohm [employer] Haasx20 Quad press 10x 1-2 reps each leg Ankle Pump 10x 1-2 reps each ankle  Trunk strengthening: Extension- vertical arm raises Rotation   Arm strengthening Bicep curls (with red theraband) Single arm row ( with red theraband)

## 2016-08-02 NOTE — Therapy (Signed)
[facility] [address] [place], [place], [address] Phone: [phone]   Fax:  [phone]  Physical Therapy Treatment  Patient Details  Name: Wayne Nichols MRN: [id number] Date of Birth: 05-07-1991 Referring Provider: Dr. Venetia Night  Encounter Date: 08/02/2016      PT End of Session - 08/02/16 1551    Visit Number 2   Number of Visits 4   Date for PT Re-Evaluation 09/14/16   Authorization Type Medicaid-awaiting auth approval   PT Start Time 1450   PT Stop Time 1530   PT Time Calculation (min) 40 min   Equipment Utilized During Treatment Gait belt   Activity Tolerance Patient tolerated treatment well   Behavior During Therapy [facility] for tasks assessed/performed      Past Medical History:  Diagnosis Date  . Hemorrhoids   . Seasonal allergies   . Spinal muscular atrophy [facility])     Past Surgical History:  Procedure Laterality Date  . BACK SURGERY    . MUSCLE BIOPSY    . TENDON RELEASE      There were no vitals filed for this visit.      Subjective Assessment - 08/02/16 1453    Subjective Rods are all along spinal column to pelvis per pt ; doesn't remember levels but may bring in x-ray next visit. Pt has a standing frame in storage.   Patient is accompained by: --  Roland-stepdad in lobby   Pertinent History Spinal muscular atrophy, depression and anxiety, hx of spinal rods (need to clarify exact location)   Patient Stated Goals Improve independence and get stronger.    Currently in Pain? No/denies   Pain Onset More than a month ago                         [facility] Adult PT Treatment/Exercise - 08/02/16 0001      Transfers   Transfers --   Lateral/Scoot Transfers 2: Max assist;Other (comment)   Lateral/Scoot Transfer Details (indicate cue type and reason) +2 to assist for safety     Exercises   Exercises Lumbar;Knee/Hip;Ankle;Other Exercises  See pt instruction for exercises 5-10 reps  each with cues for technique                PT Education - 08/02/16 1549    Education provided Yes   Education Details Benefits on using standing frame and initiated HEP with pt standing in standing frame.   Person(s) Educated Patient   Methods Explanation;Demonstration;Verbal cues;Handout;Tactile cues   Comprehension Verbalized understanding;Returned demonstration;Verbal cues required          PT Short Term Goals - 07/16/16 1652      PT SHORT TERM GOAL #1   Title Pt will be IND in HEP to improve strength, balance, and flexibility. TARGET DATE FOR ALL STGS: 08/13/16   Baseline No HEP   Time 4   Period Weeks   Status New     PT SHORT TERM GOAL #2   Title Pt will perform w/c<>mat with mod A in order to improve functional mobility.    Baseline Max A during w/c<>mat txfs   Time 4   Period Weeks   Status New     PT SHORT TERM GOAL #3   Title Pt will perform supine<>sit with mod A to improve functional mobility.    Baseline Max A during supine<>sit txfs   Time 4   Period Weeks   Status New  PT Long Term Goals - 07/16/16 1654      PT LONG TERM GOAL #1   Title Pt will be able to tolerate 5 minutes in the standing frame to improve bone density and improve skin integrity. TARGET DATE FOR ALL LTGS: 09/10/16   Baseline unable to tolerate any minutes in standing frame   Time 8   Period Weeks   Status New     PT LONG TERM GOAL #2   Title Pt will perform supine<>sit with min A to improve functional mobility.    Baseline Max A during supine<>sit txfs   Time 8   Period Weeks   Status New     PT LONG TERM GOAL #3   Title Pt will perform w/c<>mat with min A to improve functional mobility.    Baseline Max A during w/c<>mat txfs   Time 8   Period Weeks   Status New               Plan - 08/02/16 1552    Clinical Impression Statement Pt tolerated strength training in standing frame well.  Pt was unable to come up into full extension due to  tightness in hips and knees but did increase tolerance to greater extension during session.  Pt required Min A with exercises without UE support for trunk control.                                                     Rehab Potential Good   Clinical Impairments Affecting Rehab Potential co-morbidities   PT Frequency Other (comment)   PT Duration Other (comment)  Eval plus 3 visits over 8 weeks   PT Treatment/Interventions ADLs/Self Care Home Management;Biofeedback;DME Instruction;Functional mobility training;Therapeutic activities;Therapeutic exercise;Balance training;Neuromuscular re-education;Passive range of motion;Manual techniques;Orthotic Fit/Training;Patient/family education;Vestibular   PT Next Visit Plan Initiate flexibility, strengthening and balance HEP. W/c<>mat txfs ( SLIDE BOARD?) and bed mobilty.   Consulted and Agree with Plan of Care Patient      Patient will benefit from skilled therapeutic intervention in order to improve the following deficits and impairments:  Decreased endurance, Decreased knowledge of use of DME, Decreased balance, Decreased mobility, Impaired flexibility, Postural dysfunction, Impaired UE functional use, Decreased strength, Abnormal gait  Visit Diagnosis: Muscle weakness (generalized)  Other abnormalities of gait and mobility     Problem List Patient Active Problem List   Diagnosis Date Noted  . Acne 05/01/2016  . Anxiety and depression 05/01/2016  . Muscular atrophy, spinal (HCC) 03/01/2015  . Spinal muscular atrophy (HCC) 10/08/2011    Hortencia ConradiKarissa Abdelaziz Westenberger, PTA  08/02/16, 4:01 PM [facility] [address] [place], [place], [address] Phone: [phone]   Fax:  [phone]  Name: Wayne Nichols MRN: [id number] Date of Birth: [birth year]

## 2016-08-09 ENCOUNTER — Ambulatory Visit: Payer: Medicaid Other | Admitting: Physical Therapy

## 2016-08-09 ENCOUNTER — Encounter: Payer: Self-pay | Admitting: Physical Therapy

## 2016-08-09 DIAGNOSIS — R2689 Other abnormalities of gait and mobility: Secondary | ICD-10-CM

## 2016-08-09 DIAGNOSIS — M6281 Muscle weakness (generalized): Secondary | ICD-10-CM | POA: Diagnosis not present

## 2016-08-09 NOTE — Patient Instructions (Signed)
Pressure Relief:   1-92min leaning on each side every  30-60 min  Slide board- 30"

## 2016-08-09 NOTE — Therapy (Signed)
[facility] [address] [place], [place], [address] Phone: [phone]   Fax:  [phone]  Physical Therapy Treatment  Patient Details  Name: Wayne Nichols MRN: [id number] Date of Birth: [birth year] Referring Provider: Dr. Venetia Night  Encounter Date: 08/09/2016      PT End of Session - 08/09/16 1547    Visit Number 3   Number of Visits 4   Date for PT Re-Evaluation 09/14/16   Authorization Type Medicaid-awaiting auth approval   PT Start Time 1447   PT Stop Time 1530   PT Time Calculation (min) 43 min   Equipment Utilized During Treatment Gait belt   Activity Tolerance Patient tolerated treatment well   Behavior During Therapy [facility] for tasks assessed/performed      Past Medical History:  Diagnosis Date  . Hemorrhoids   . Seasonal allergies   . Spinal muscular atrophy [facility])     Past Surgical History:  Procedure Laterality Date  . BACK SURGERY    . MUSCLE BIOPSY    . TENDON RELEASE      There were no vitals filed for this visit.      Subjective Assessment - 08/09/16 1450    Subjective Pt has not been able to get stander from storage.   Patient is accompained by: --  Roland-stepdad in lobby   Pertinent History Spinal muscular atrophy, depression and anxiety, hx of spinal rods (need to clarify exact location)   Patient Stated Goals Improve independence and get stronger.    Currently in Pain? No/denies   Pain Onset More than a month ago                         [facility] Adult PT Treatment/Exercise - 08/09/16 0001      Bed Mobility   Bed Mobility Rolling Right;Supine to Sit;Sit to Supine   Rolling Right 5: Supervision   Supine to Sit 2: Max assist  Assist for LEs and upper trunk weight shift   Sit to Supine 3: Mod assist   Sit to Supine - Details (indicate cue type and reason) assist for LEs     Transfers   Transfers Lateral/Scoot Transfers  slide board   Lateral/Scoot Transfers 4:  Min assist  for board placement, decreased assist downhill   Lateral/Scoot Transfer Details (indicate cue type and reason) cues for weight shifting and body position.                PT Education - 08/09/16 1546    Education provided Yes   Education Details Educated pt on benefits and importance of pressure relief.  Slide board transfers.   Person(s) Educated Patient   Methods Explanation;Demonstration;Verbal cues;Handout;Tactile cues   Comprehension Verbalized understanding;Returned demonstration          PT Short Term Goals - 07/16/16 1652      PT SHORT TERM GOAL #1   Title Pt will be IND in HEP to improve strength, balance, and flexibility. TARGET DATE FOR ALL STGS: 08/13/16   Baseline No HEP   Time 4   Period Weeks   Status New     PT SHORT TERM GOAL #2   Title Pt will perform w/c<>mat with mod A in order to improve functional mobility.    Baseline Max A during w/c<>mat txfs   Time 4   Period Weeks   Status New     PT SHORT TERM GOAL #3   Title Pt will perform  supine<>sit with mod A to improve functional mobility.    Baseline Max A during supine<>sit txfs   Time 4   Period Weeks   Status New           PT Long Term Goals - 07/16/16 1654      PT LONG TERM GOAL #1   Title Pt will be able to tolerate 5 minutes in the standing frame to improve bone density and improve skin integrity. TARGET DATE FOR ALL LTGS: 09/10/16   Baseline unable to tolerate any minutes in standing frame   Time 8   Period Weeks   Status New     PT LONG TERM GOAL #2   Title Pt will perform supine<>sit with min A to improve functional mobility.    Baseline Max A during supine<>sit txfs   Time 8   Period Weeks   Status New     PT LONG TERM GOAL #3   Title Pt will perform w/c<>mat with min A to improve functional mobility.    Baseline Max A during w/c<>mat txfs   Time 8   Period Weeks   Status New               Plan - 08/09/16 1548    Clinical Impression Statement  Slide board transfer seems to be a good option; pt requiring min A with level transfers  (powered w/c is able to go up/down).  Pt continues to need extensive assist for bed mobility even with trialling external UE support.   Rehab Potential Good   Clinical Impairments Affecting Rehab Potential co-morbidities   PT Frequency Other (comment)   PT Duration Other (comment)   PT Treatment/Interventions ADLs/Self Care Home Management;Biofeedback;DME Instruction;Functional mobility training;Therapeutic activities;Therapeutic exercise;Balance training;Neuromuscular re-education;Passive range of motion;Manual techniques;Orthotic Fit/Training;Patient/family education;Vestibular   PT Next Visit Plan check goals and d/c      Patient will benefit from skilled therapeutic intervention in order to improve the following deficits and impairments:  Decreased endurance, Decreased knowledge of use of DME, Decreased balance, Decreased mobility, Impaired flexibility, Postural dysfunction, Impaired UE functional use, Decreased strength, Abnormal gait  Visit Diagnosis: Muscle weakness (generalized)  Other abnormalities of gait and mobility     Problem List Patient Active Problem List   Diagnosis Date Noted  . Acne 05/01/2016  . Anxiety and depression 05/01/2016  . Muscular atrophy, spinal (HCC) 03/01/2015  . Spinal muscular atrophy (HCC) 10/08/2011    Hortencia ConradiKarissa Tesneem Dufrane, PTA  08/09/16, 3:52 PM [facility] [address] [place], [place], [address] Phone: [phone]   Fax:  [phone]  Name: Jacki Conesatrick I Decoste MRN: [id number] Date of Birth: [birth year]

## 2016-08-16 ENCOUNTER — Ambulatory Visit: Payer: Medicaid Other | Admitting: Physical Therapy

## 2016-08-16 DIAGNOSIS — M6281 Muscle weakness (generalized): Secondary | ICD-10-CM

## 2016-08-16 DIAGNOSIS — R2689 Other abnormalities of gait and mobility: Secondary | ICD-10-CM

## 2016-08-16 NOTE — Therapy (Addendum)
[facility] [address] [place], [place], [address] Phone: [phone]   Fax:  [phone]  Physical Therapy Treatment  Patient Details  Name: Wayne Nichols MRN: [id number] Date of Birth: 02-01-1991 Referring Provider: Dr. Jarold Song  Encounter Date: 08/16/2016      PT End of Session - 08/16/16 1547    Visit Number 4   Number of Visits 4   Date for PT Re-Evaluation 09/14/16   Authorization Type Medicaid-awaiting auth approval   PT Start Time 1447   PT Stop Time 1530   PT Time Calculation (min) 43 min   Equipment Utilized During Treatment Gait belt   Activity Tolerance Patient tolerated treatment well   Behavior During Therapy [facility] for tasks assessed/performed      Past Medical History:  Diagnosis Date  . Hemorrhoids   . Seasonal allergies   . Spinal muscular atrophy [facility])     Past Surgical History:  Procedure Laterality Date  . BACK SURGERY    . MUSCLE BIOPSY    . TENDON RELEASE      There were no vitals filed for this visit.      Subjective Assessment - 08/16/16 1450    Subjective Pt has been able to get slide board and bed rail and now required min A or less with these transfers at home. Pt has gotten stander out of storage but hasn't been able to use it yet.   Patient is accompained by: --  Roland-stepdad in lobby   Pertinent History Spinal muscular atrophy, depression and anxiety, hx of spinal rods (need to clarify exact location)   Patient Stated Goals Improve independence and get stronger.    Currently in Pain? No/denies   Pain Onset More than a month ago                         [facility] Adult PT Treatment/Exercise - 08/16/16 0001      Transfers   Transfers Lateral/Scoot Transfers  sliding board   Lateral/Scoot Transfers 4: Min assist   Lateral/Scoot Transfer Details (indicate cue type and reason) Min cues for body mechanics, assist for board placement due to UE weakness.     Exercises   Exercises Shoulder;Lumbar  Performed strengthening exercises with yellow band for bicep     Knee/Hip Exercises: Standing   Other Standing Knee Exercises standing frame- 5 min working on LE and trunk stretching.  LE strengthening- quad and glute sets x20 each.                PT Education - 08/16/16 1525    Education provided Yes   Education Details Options for UE strengthening with Theraband using door jam for anchor   Person(s) Educated Patient   Methods Explanation;Demonstration;Verbal cues;Handout   Comprehension Verbalized understanding;Returned demonstration;Need further instruction          PT Short Term Goals - 07/16/16 1652      PT SHORT TERM GOAL #1   Title Pt will be IND in HEP to improve strength, balance, and flexibility. TARGET DATE FOR ALL STGS: 08/13/16   Baseline No HEP   Time 4   Period Weeks   Status New     PT SHORT TERM GOAL #2   Title Pt will perform w/c<>mat with mod A in order to improve functional mobility.    Baseline Max A during w/c<>mat txfs   Time 4   Period Weeks   Status New  PT SHORT TERM GOAL #3   Title Pt will perform supine<>sit with mod A to improve functional mobility.    Baseline Max A during supine<>sit txfs   Time 4   Period Weeks   Status New           PT Long Term Goals - 08/16/16 1548      PT LONG TERM GOAL #1   Title Pt will be able to tolerate 5 minutes in the standing frame to improve bone density and improve skin integrity. TARGET DATE FOR ALL LTGS: 09/10/16   Baseline Able to tolerate up to 15 min.   Time 8   Period Weeks   Status Achieved     PT LONG TERM GOAL #2   Title Pt will perform supine<>sit with min A to improve functional mobility.    Baseline Max A during supine<>sit txfs in gym, but per pt, min A with newly obtained bedrail at home.   Time 8   Period Weeks   Status Partially Met     PT LONG TERM GOAL #3   Title Pt will perform w/c<>mat with min A to improve functional  mobility.    Baseline Min A on slide board with level surfaces or inclines (powered W/C height is adjustable   Time 8   Period Weeks   Status Achieved               Plan - 08/16/16 1551    Clinical Impression Statement Pt met LTGs #1and 3 and partially met #2.  Pt demonstrated understanding of HEP options for  UE/LE exercises using Theraband and standing frame. Pt has made progress with mobility using slide board and bed rail.   Rehab Potential Good   Clinical Impairments Affecting Rehab Potential co-morbidities   PT Frequency Other (comment)   PT Duration Other (comment)   PT Treatment/Interventions ADLs/Self Care Home Management;Biofeedback;DME Instruction;Functional mobility training;Therapeutic activities;Therapeutic exercise;Balance training;Neuromuscular re-education;Passive range of motion;Manual techniques;Orthotic Fit/Training;Patient/family education;Vestibular   PT Next Visit Plan d/c summary to MD      Patient will benefit from skilled therapeutic intervention in order to improve the following deficits and impairments:  Decreased endurance, Decreased knowledge of use of DME, Decreased balance, Decreased mobility, Impaired flexibility, Postural dysfunction, Impaired UE functional use, Decreased strength, Abnormal gait  Visit Diagnosis: Muscle weakness (generalized)  Other abnormalities of gait and mobility     Problem List Patient Active Problem List   Diagnosis Date Noted  . Acne 05/01/2016  . Anxiety and depression 05/01/2016  . Muscular atrophy, spinal ([place]) 03/01/2015  . Spinal muscular atrophy ([place]) 10/08/2011    Bjorn Loser, PTA  08/16/16, 3:56 PM [facility] [address] [place], [place], [address] Phone: [phone]   Fax:  [phone]  Name: Wayne Nichols MRN: [id number] Date of Birth: 1991/03/21  PHYSICAL THERAPY DISCHARGE SUMMARY  Visits from Start of Care: 4  Current  functional level related to goals / functional outcomes:     PT Long Term Goals - 08/16/16 1548      PT LONG TERM GOAL #1   Title Pt will be able to tolerate 5 minutes in the standing frame to improve bone density and improve skin integrity. TARGET DATE FOR ALL LTGS: 09/10/16   Baseline Able to tolerate up to 15 min.   Time 8   Period Weeks   Status Achieved     PT LONG TERM GOAL #2   Title Pt will perform supine<>sit with  min A to improve functional mobility.    Baseline Max A during supine<>sit txfs in gym, but per pt, min A with newly obtained bedrail at home.   Time 8   Period Weeks   Status Partially Met     PT LONG TERM GOAL #3   Title Pt will perform w/c<>mat with min A to improve functional mobility.    Baseline Min A on slide board with level surfaces or inclines (powered W/C height is adjustable   Time 8   Period Weeks   Status Achieved        Remaining deficits: Impaired balance and strength but visits limited by insurance.   Education / Equipment: HEP, slide board and standing frame.  Plan: Patient agrees to discharge.  Patient goals were partially met. Patient is being discharged due to financial reasons.  ?????        Geoffry Paradise, PT,DPT 08/16/16 5:05 PM Phone: [phone] Fax: [phone]

## 2016-08-16 NOTE — Patient Instructions (Addendum)
Scapular Retraction: Rowing (Eccentric) - Arms - 45 Degrees (Resistance Band)    Hold end of band in each hand. Pull back until elbows are even with trunk. Keep elbows out from sides at 45, thumbs up. Slowly release for 3-5 seconds. Use ________ resistance band. ___ reps per set, ___ sets per day, ___ days per week.   http://ecce.exer.us/228   Copyright  VHI. All rights reserved.  Arm Curl With Band    With band under front wheels or legs of chair, alternate curling each hand up toward shoulder. Hold ____ seconds. Repeat ____ times. Do ____ sessions per day.  Copyright  VHI. All rights reserved.  (Clinic) Extension: Short Head Triceps Press    Facing pulley, elbows against sides. Push down on bar, straightening arms, keeping elbows at sides. Repeat ____ times per set. Do ____ sets per session. Do ____ sessions per week. Use ____ lb weights.  Copyright  VHI. All rights reserved.  (Clinic) Diagonal: Flexion Diagonal Pattern, PNF - Standing    Left side toward pulley, reach up and out to same side. Pull rope down and out to other side, bending knees. Repeat ____ times per set. Do ____ sets per session. Do ____ sessions per week. Use ____ lb weights.  Copyright  VHI. All rights reserved.

## 2016-09-18 ENCOUNTER — Ambulatory Visit: Payer: Medicaid Other | Admitting: Sports Medicine

## 2016-09-21 ENCOUNTER — Encounter: Payer: Self-pay | Admitting: Podiatry

## 2016-09-21 ENCOUNTER — Other Ambulatory Visit: Payer: Self-pay | Admitting: Family Medicine

## 2016-09-21 ENCOUNTER — Ambulatory Visit (INDEPENDENT_AMBULATORY_CARE_PROVIDER_SITE_OTHER): Payer: Medicaid Other | Admitting: Podiatry

## 2016-09-21 VITALS — BP 145/94 | HR 72 | Resp 16

## 2016-09-21 DIAGNOSIS — B353 Tinea pedis: Secondary | ICD-10-CM

## 2016-09-21 DIAGNOSIS — L600 Ingrowing nail: Secondary | ICD-10-CM | POA: Diagnosis not present

## 2016-09-21 NOTE — Patient Instructions (Signed)

## 2016-09-22 ENCOUNTER — Encounter (HOSPITAL_COMMUNITY): Payer: Self-pay | Admitting: *Deleted

## 2016-09-22 DIAGNOSIS — Y999 Unspecified external cause status: Secondary | ICD-10-CM | POA: Diagnosis not present

## 2016-09-22 DIAGNOSIS — S90822A Blister (nonthermal), left foot, initial encounter: Secondary | ICD-10-CM | POA: Diagnosis not present

## 2016-09-22 DIAGNOSIS — Y939 Activity, unspecified: Secondary | ICD-10-CM | POA: Insufficient documentation

## 2016-09-22 DIAGNOSIS — X58XXXA Exposure to other specified factors, initial encounter: Secondary | ICD-10-CM | POA: Diagnosis not present

## 2016-09-22 DIAGNOSIS — Y929 Unspecified place or not applicable: Secondary | ICD-10-CM | POA: Insufficient documentation

## 2016-09-22 DIAGNOSIS — S99922A Unspecified injury of left foot, initial encounter: Secondary | ICD-10-CM | POA: Diagnosis present

## 2016-09-22 NOTE — ED Triage Notes (Signed)
The pt had a lt great toe ingrown toenail  Removed yesterday  Today the foot is very painful and swollen with drainage no temp

## 2016-09-23 ENCOUNTER — Emergency Department (HOSPITAL_COMMUNITY)
Admission: EM | Admit: 2016-09-23 | Discharge: 2016-09-23 | Disposition: A | Discharge status: Home / Self Care | Payer: Medicaid Other | Attending: Emergency Medicine | Admitting: Emergency Medicine

## 2016-09-23 DIAGNOSIS — S90425A Blister (nonthermal), left lesser toe(s), initial encounter: Secondary | ICD-10-CM

## 2016-09-23 MED ORDER — CEPHALEXIN 500 MG PO CAPS: 500.0000 mg | ORAL_CAPSULE | Freq: Four times a day (QID) | ORAL | 0 refills | Status: DC

## 2016-09-23 MED ORDER — HYDROCODONE-ACETAMINOPHEN 5-325 MG PO TABS: 1.0000 | ORAL_TABLET | Freq: Four times a day (QID) | ORAL | 0 refills | Status: DC | PRN

## 2016-09-23 MED ORDER — CEPHALEXIN 250 MG PO CAPS
500.0000 mg | ORAL_CAPSULE | Freq: Once | ORAL | Status: AC
  Administered 2016-09-23: 500 mg via ORAL
  Filled 2016-09-23: qty 2

## 2016-09-23 MED ORDER — HYDROCODONE-ACETAMINOPHEN 5-325 MG PO TABS
1.0000 | ORAL_TABLET | Freq: Once | ORAL | Status: AC
  Administered 2016-09-23: 1 via ORAL
  Filled 2016-09-23: qty 1

## 2016-09-23 NOTE — Discharge Instructions (Signed)
Keep your toe clean and dry. Change the dressing twice a day. Apply topical antibiotic ointment. Take Keflex as prescribed to prevent infection. Take ibuprofen or Tylenol for pain. Take Norco for severe pain. Please follow-up with your foot doctor on Monday. Return tomorrow if any worsening symptoms.

## 2016-09-23 NOTE — Progress Notes (Signed)
Subjective:     Patient ID: Wayne Nichols, male   DOB: Feb 15, 1991, 25 y.o.   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      Objective:   Physical Exam  Constitutional: He is oriented to person, place, and time.  Cardiovascular: Intact distal pulses.   Musculoskeletal: Normal range of motion.  Neurological: He is oriented to person, place, and time.  Skin: Skin is warm and dry.  Nursing note and vitals reviewed.  neurovascular status was intact with muscle strength diminished secondary to spinal atrophy with incurvated nail border left hallux medial border with other border slightly incurvated but not to the same degree. Patient's found have good digital perfusion and is well oriented 3     Assessment:     Ingrown toenail deformity left hallux medial border with pain    Plan:     H&P condition reviewed and recommended removal of the corner. Patient wants surgery and today I infiltrated the left hallux 60 mg Xylocaine Marcaine mixture remove the border exposed matrix and applied phenol 3 applications 30 seconds followed by alcohol lavage and sterile dressing. Gave instructions on soaks and reappoint

## 2016-09-23 NOTE — ED Provider Notes (Signed)
[facility]-EMERGENCY DEPT Provider Note   CSN: [id number] Arrival date & time: 09/22/16  2247     History   Chief Complaint Chief Complaint  Patient presents with  . Foot Pain    HPI Wayne Nichols is a 25 y.o. male.  HPI Wayne Nichols is a 25 y.o. male with hx or seasonal allergies, spinal muscular atrophy, presents to ED with left great toe pain. Pt states he had an ingrown toenail removed at [facility] yesterday. States He had a toe block performed, and dressing was placed on the toe. He was told to remove the dressing 24 hours later. He states when he removed the dressing, he noticed a small blister to his toe. He states within several minutes, toe continued to swell and developed larger blisters. Toe is draining clear fluid. He reports severe pain to the toe. He denies any purulent drainage. He denies any bleeding. He denies any other associated symptoms. He denies any injury to the toe.  Past Medical History:  Diagnosis Date  . Hemorrhoids   . Seasonal allergies   . Spinal muscular atrophy [facility](HCC)     Patient Active Problem List   Diagnosis Date Noted  . Acne 05/01/2016  . Anxiety and depression 05/01/2016  . Muscular atrophy, spinal ([facility]) 03/01/2015  . Spinal muscular atrophy ([facility]) 10/08/2011    Past Surgical History:  Procedure Laterality Date  . BACK SURGERY    . MUSCLE BIOPSY    . TENDON RELEASE         Home Medications    Prior to Admission medications   Medication Sig Start Date End Date Taking? Authorizing Provider  albuterol (PROVENTIL HFA;VENTOLIN HFA) 108 (90 BASE) MCG/ACT inhaler Inhale 2 puffs into the lungs every 6 (six) hours as needed for wheezing. 08/15/15   Ambrose FinlandValerie A Keck, NP  benzoyl peroxide 10 % gel Apply topically.    Historical Provider, MD  clindamycin (CLEOCIN T) 1 % external solution Apply topically.    Historical Provider, MD  clotrimazole (LOTRIMIN) 1 % external solution Apply 1 application topically 2 (two) times daily.  Use in between toes on right foot 12/05/15   Asencion Islamitorya Stover, DPM  FLUoxetine (PROZAC) 20 MG capsule Take 1 capsule (20 mg total) by mouth daily. 05/28/16   Jaclyn ShaggyEnobong Amao, MD  fluticasone (FLONASE) 50 MCG/ACT nasal spray Place 2 sprays into both nostrils daily. 02/18/15   Ambrose FinlandValerie A Keck, NP  ibuprofen (ADVIL,MOTRIN) 200 MG tablet Take 200-400 mg by mouth every 6 (six) hours as needed for moderate pain.     Historical Provider, MD  loratadine (CLARITIN) 10 MG tablet TAKE 1 TABLET BY MOUTH DAILY AS NEEDED FOR ALLERGIES 09/21/16   Jaclyn ShaggyEnobong Amao, MD  phenylephrine-shark liver oil-mineral oil-petrolatum (PREPARATION H) 0.25-3-14-71.9 % rectal ointment Place 1 application rectally 2 (two) times daily as needed for hemorrhoids. Reported on 05/01/2016    Historical Provider, MD  tretinoin (RETIN-A) 0.025 % cream Apply topically at bedtime.    Historical Provider, MD    Family History Family History  Problem Relation Age of Onset  . Stroke Maternal Grandmother   . Heart disease Maternal Grandmother     Social History Social History  Substance Use Topics  . Smoking status: Never Smoker  . Smokeless tobacco: Never Used  . Alcohol use 0.0 oz/week     Comment: Occasional     Allergies   Amoxicillin and Penicillins   Review of Systems Review of Systems  Constitutional: Negative for chills and fever.  Musculoskeletal: Positive for arthralgias and joint swelling.  Skin: Positive for color change and wound.  Neurological: Negative for weakness and numbness.  All other systems reviewed and are negative.    Physical Exam Updated Vital Signs BP 126/95   Pulse 67   Temp 97.9 F (36.6 C) (Oral)   Resp 16   SpO2 98%   Physical Exam  Constitutional: He appears well-developed and well-nourished. No distress.  Eyes: Conjunctivae are normal.  Neck: Neck supple.  Cardiovascular: Normal rate.   Pulmonary/Chest: No respiratory distress.  Abdominal: He exhibits no distension.  Musculoskeletal:  Left  great toe erythematous, edematous. There is a small wedge resection of the medial aspect of the toenail. No purulent drainage. Healing well. There is large blisters to the dorsal proximal toe and to the toe pad. Diffuse tenderness to palpation. Capillary refill less than 2 seconds over the toenail.  Skin: Skin is warm and dry.  Nursing note and vitals reviewed.    ED Treatments / Results  Labs (all labs ordered are listed, but only abnormal results are displayed) Labs Reviewed - No data to display  EKG  EKG Interpretation None       Radiology No results found.  Procedures Procedures (including critical care time)  Medications Ordered in ED Medications - No data to display   Initial Impression / Assessment and Plan / ED Course  I have reviewed the triage vital signs and the nursing notes.  Pertinent labs & imaging results that were available during my care of the patient were reviewed by me and considered in my medical decision making (see chart for details).    Clinical Course     Patient with swelling, blistering of the left great toe, after wedge resection of the nail yesterday for ingrown toenail. Patient admits to having a tight dressing on the toe that he removed today. Question reaction to the local anesthetic versus revascularization injury after removing a tight dressing. At this time, there is still good capillary refill to the distal toe. Sensation is intact. Will treat with prophylactic antibiotic, pain medications, careful wound care. Advised to return tomorrow if any worsening changes to the toe, otherwise follow-up with his foot doctor on Monday. All questions answered. Dr. Wilkie AyeHorton seen pt as well and agrees tot he plan.   Vitals:   09/22/16 2323  BP: 126/95  Pulse: 67  Resp: 16  Temp: 97.9 F (36.6 C)  TempSrc: Oral  SpO2: 98%     Final Clinical Impressions(s) / ED Diagnoses   Final diagnoses:  Blister of toe of left foot without infection, initial  encounter    New Prescriptions Discharge Medication List as of 09/23/2016 12:46 AM    START taking these medications   Details  cephALEXin (KEFLEX) 500 MG capsule Take 1 capsule (500 mg total) by mouth 4 (four) times daily., Starting Sun 09/23/2016, Print    HYDROcodone-acetaminophen (NORCO) 5-325 MG tablet Take 1 tablet by mouth every 6 (six) hours as needed for moderate pain., Starting Sun 09/23/2016, Print         Jaynie Crumbleatyana Sanuel Ladnier, PA-C 09/23/16 [id number]    Shon Batonourtney F Horton, MD 09/23/16 [phone]

## 2016-09-23 NOTE — ED Provider Notes (Signed)
Medical screening examination/treatment/procedure(s) were conducted as a shared visit with non-physician practitioner(s) and myself.  I personally evaluated the patient during the encounter.   EKG Interpretation None        Patient presents with blistering of the left great toe. Had trephination yesterday and Koban was applied. Patient reports that it was tight. Upon removing the Koban he noted small blistering and numbness but had immediate worsening swelling and pain to the toe. Toe is as above. Decreased sensation at the tip of the toe but good capillary refill under the nailbed. He has large blisters over the dorsal and plantar aspect of the toe. No significant erythema to suggest infection. Question reperfusion injury given reports that Koban was tight. Supportive measures and wound care. Follow-up on Monday at the clinic. If he has any new or worsening symptoms he was encouraged to return immediately.   Shon Batonourtney F [name], MD 09/23/16 [phone]

## 2016-09-24 ENCOUNTER — Ambulatory Visit (INDEPENDENT_AMBULATORY_CARE_PROVIDER_SITE_OTHER): Payer: Medicaid Other | Admitting: Podiatry

## 2016-09-24 ENCOUNTER — Encounter: Payer: Self-pay | Admitting: Podiatry

## 2016-09-24 VITALS — BP 110/74 | HR 75 | Temp 97.7°F | Resp 11

## 2016-09-24 DIAGNOSIS — L03032 Cellulitis of left toe: Secondary | ICD-10-CM

## 2016-09-26 NOTE — Progress Notes (Signed)
Subjective:     Patient ID: Wayne Nichols, male   DOB: May 27, 1991, 25 y.o.   MRN: [id number]  HPI patient presents with blistering around the base of the left hallux localized with caregiver today. States he is not having pain but he is concerned about blistering and what it may be coming from   Review of Systems     Objective:   Physical Exam No change in neurovascular status with large blisters on the proximal portion of the hallux left localized in nature with no proximal edema erythema drainage noted and no systemic signs of infection    Assessment:     Localized abscess left hallux which may be due to reaction to chemical or possibly reaction to soaks     Plan:     H&P and condition discussed with him and caregiver. At this point using 22-gauge needle I did go ahead and I drained the areas and found that fluid to be sterile. I went ahead and applied Silvadene sterile dressing advised on compression and monitoring and if any breakdown of tissue or any proximal signs of infection were to occur he is to let us know immediately. At this time it should heal unremarkably

## 2016-10-18 ENCOUNTER — Other Ambulatory Visit: Payer: Self-pay | Admitting: Family Medicine

## 2016-11-02 ENCOUNTER — Telehealth: Payer: Self-pay | Admitting: *Deleted

## 2016-11-02 NOTE — Telephone Encounter (Addendum)
-----   Message from Estrella [facility]le[facility]yl [facility] sent at 11/02/2016  3:00 PM EST ----- Contact: [phone] Patient says toe is wrapped too tight with blisters and toenail has come off. I spoke with pt and he states he doesn't have the tight dressing or blisters any longer, he is concerned the toenail came off. I told pt that occurred on occasion and it may grow back normal or it may not grow back at all. I told the pt to keep area covered for protection until it wasn't very sensitive when in shoes. Pt denies redness, swelling or drainage. I told pt to call if he had any questions.

## 2017-01-09 ENCOUNTER — Other Ambulatory Visit: Payer: Self-pay | Admitting: Family Medicine

## 2017-03-06 ENCOUNTER — Ambulatory Visit: Payer: Medicaid Other | Admitting: Family Medicine

## 2017-04-17 ENCOUNTER — Encounter: Payer: Self-pay | Admitting: Family Medicine

## 2017-04-17 ENCOUNTER — Ambulatory Visit: Payer: Medicaid Other | Attending: Family Medicine | Admitting: Family Medicine

## 2017-04-17 VITALS — BP 102/67 | HR 82 | Temp 97.5°F

## 2017-04-17 DIAGNOSIS — J302 Other seasonal allergic rhinitis: Secondary | ICD-10-CM | POA: Diagnosis not present

## 2017-04-17 DIAGNOSIS — G129 Spinal muscular atrophy, unspecified: Secondary | ICD-10-CM | POA: Insufficient documentation

## 2017-04-17 DIAGNOSIS — Z880 Allergy status to penicillin: Secondary | ICD-10-CM | POA: Diagnosis not present

## 2017-04-17 DIAGNOSIS — F329 Major depressive disorder, single episode, unspecified: Secondary | ICD-10-CM | POA: Diagnosis not present

## 2017-04-17 DIAGNOSIS — Z13228 Encounter for screening for other metabolic disorders: Secondary | ICD-10-CM

## 2017-04-17 DIAGNOSIS — Z993 Dependence on wheelchair: Secondary | ICD-10-CM | POA: Diagnosis not present

## 2017-04-17 DIAGNOSIS — Z9889 Other specified postprocedural states: Secondary | ICD-10-CM | POA: Insufficient documentation

## 2017-04-17 DIAGNOSIS — L700 Acne vulgaris: Secondary | ICD-10-CM | POA: Diagnosis not present

## 2017-04-17 DIAGNOSIS — J3089 Other allergic rhinitis: Secondary | ICD-10-CM | POA: Diagnosis not present

## 2017-04-17 MED ORDER — ALBUTEROL SULFATE HFA 108 (90 BASE) MCG/ACT IN AERS: 2.0000 | INHALATION_SPRAY | Freq: Four times a day (QID) | RESPIRATORY_TRACT | 6 refills | Status: DC | PRN

## 2017-04-17 NOTE — Patient Instructions (Signed)
Allergic Rhinitis Allergic rhinitis is when the mucous membranes in the nose respond to allergens. Allergens are particles in the air that cause your body to have an allergic reaction. This causes you to release allergic antibodies. Through a chain of events, these eventually cause you to release histamine into the blood stream. Although meant to protect the body, it is this release of histamine that causes your discomfort, such as frequent sneezing, congestion, and an itchy, runny nose. What are the causes? Seasonal allergic rhinitis (hay fever) is caused by pollen allergens that may come from grasses, trees, and weeds. Year-round allergic rhinitis (perennial allergic rhinitis) is caused by allergens such as house dust mites, pet dander, and mold spores. What are the signs or symptoms?  Nasal stuffiness (congestion).  Itchy, runny nose with sneezing and tearing of the eyes. How is this diagnosed? Your health care provider can help you determine the allergen or allergens that trigger your symptoms. If you and your health care provider are unable to determine the allergen, skin or blood testing may be used. Your health care provider will diagnose your condition after taking your health history and performing a physical exam. Your health care provider may assess you for other related conditions, such as asthma, pink eye, or an ear infection. How is this treated? Allergic rhinitis does not have a cure, but it can be controlled by:  Medicines that block allergy symptoms. These may include allergy shots, nasal sprays, and oral antihistamines.  Avoiding the allergen. Hay fever may often be treated with antihistamines in pill or nasal spray forms. Antihistamines block the effects of histamine. There are over-the-counter medicines that may help with nasal congestion and swelling around the eyes. Check with your health care provider before taking or giving this medicine. If avoiding the allergen or the  medicine prescribed do not work, there are many new medicines your health care provider can prescribe. Stronger medicine may be used if initial measures are ineffective. Desensitizing injections can be used if medicine and avoidance does not work. Desensitization is when a patient is given ongoing shots until the body becomes less sensitive to the allergen. Make sure you follow up with your health care provider if problems continue. Follow these instructions at home: It is not possible to completely avoid allergens, but you can reduce your symptoms by taking steps to limit your exposure to them. It helps to know exactly what you are allergic to so that you can avoid your specific triggers. Contact a health care provider if:  You have a fever.  You develop a cough that does not stop easily (persistent).  You have shortness of breath.  You start wheezing.  Symptoms interfere with normal daily activities. This information is not intended to replace advice given to you by your health care provider. Make sure you discuss any questions you have with your health care provider. Document Released: 07/03/2001 Document Revised: 06/08/2016 Document Reviewed: 06/15/2013 Elsevier Interactive Patient Education  2017 [employer]  

## 2017-04-17 NOTE — Progress Notes (Signed)
Subjective:  Patient ID: Wayne Nichols, male    DOB: September 19, 1991  Age: 26 y.o. MRN: [id number]  CC: Follow-up visit  HPI Wayne Nichols is a 26 year old male with a history of spinal muscular atrophy, acne vulgaris, seasonal allergies, anxiety and depression who presents to the clinic for a follow-up visit.   He reports improvement in his anxiety and depression no longer needs his Prozac. His acne is managed by dermatology at [facility] where he receives his tretinoin and clindamycin.   6 months ago he was seen by podiatry for left hallux abscess and in the process lost his left toenail which he is really unhappy about. States he also had a similar problem with his right foot but everything went well without him losing his toenail.  At this time he has no foot symptoms and I have explained to him that we have no justification for referral to podiatry given his foot symptoms have all resolved.   He denies pain in his muscles or joints but endorses stiffness of his extremities and he is wheelchair bound.   Past Medical History:  Diagnosis Date  . Hemorrhoids   . Seasonal allergies   . Spinal muscular atrophy [facility])     Past Surgical History:  Procedure Laterality Date  . BACK SURGERY    . MUSCLE BIOPSY    . TENDON RELEASE      Allergies  Allergen Reactions  . Amoxicillin Hives  . Penicillins Hives and Rash      Outpatient Medications Prior to Visit  Medication Sig Dispense Refill  . benzoyl peroxide 10 % gel Apply topically.    . clindamycin (CLEOCIN T) 1 % external solution Apply topically.    Marland Kitchen ibuprofen (ADVIL,MOTRIN) 200 MG tablet Take 200-400 mg by mouth every 6 (six) hours as needed for moderate pain.     Marland Kitchen loratadine (CLARITIN) 10 MG tablet TAKE 1 TABLET BY MOUTH DAILY AS NEEDED FOR ALLERGIES 30 tablet 2  . tretinoin (RETIN-A) 0.025 % cream Apply topically at bedtime.    Marland Kitchen albuterol (PROVENTIL HFA;VENTOLIN HFA) 108 (90 BASE) MCG/ACT inhaler  Inhale 2 puffs into the lungs every 6 (six) hours as needed for wheezing. 1 Inhaler 3  . FLUoxetine (PROZAC) 20 MG capsule TAKE 1 CAPSULE (20 MG TOTAL) BY MOUTH DAILY. 30 capsule 3  . phenylephrine-shark liver oil-mineral oil-petrolatum (PREPARATION H) 0.25-3-14-71.9 % rectal ointment Place 1 application rectally 2 (two) times daily as needed for hemorrhoids. Reported on 05/01/2016    . cephALEXin (KEFLEX) 500 MG capsule Take 1 capsule (500 mg total) by mouth 4 (four) times daily. (Patient not taking: Reported on 04/17/2017) 28 capsule 0  . clotrimazole (LOTRIMIN) 1 % external solution Apply 1 application topically 2 (two) times daily. Use in between toes on right foot (Patient not taking: Reported on 04/17/2017) 60 mL 1  . fluticasone (FLONASE) 50 MCG/ACT nasal spray Place 2 sprays into both nostrils daily. (Patient not taking: Reported on 04/17/2017) 16 g 6  . HYDROcodone-acetaminophen (NORCO) 5-325 MG tablet Take 1 tablet by mouth every 6 (six) hours as needed for moderate pain. (Patient not taking: Reported on 04/17/2017) 10 tablet 0   No facility-administered medications prior to visit.     ROS Review of Systems  Constitutional: Negative for activity change and appetite change.  HENT: Negative for sinus pressure and sore throat.   Eyes: Negative for visual disturbance.  Respiratory: Negative for cough, chest tightness and shortness of breath.   Cardiovascular: Negative  for chest pain and leg swelling.  Gastrointestinal: Negative for abdominal distention, abdominal pain, constipation and diarrhea.  Endocrine: Negative.   Genitourinary: Negative for dysuria.  Musculoskeletal: Negative for joint swelling and myalgias.  Skin: Negative for rash.  Allergic/Immunologic: Negative.   Neurological: Negative for weakness, light-headedness and numbness.  Psychiatric/Behavioral: Negative for dysphoric mood and suicidal ideas.    Objective:  BP 102/67   Pulse 82   Temp 97.5 F (36.4 C) (Oral)    SpO2 98%   BP/Weight 04/17/2017 09/24/2016 06/29/3531  Systolic BP 992 426 834  Diastolic BP 67 74 95  Wt. (Lbs) - - -  BMI - - -      Physical Exam  Constitutional: He is oriented to person, place, and time. He appears well-developed and well-nourished.  HENT:  Head: Normocephalic and atraumatic.  Right Ear: External ear normal.  Left Ear: External ear normal.  Eyes: Conjunctivae and EOM are normal. Pupils are equal, round, and reactive to light.  Neck: Normal range of motion. Neck supple. No tracheal deviation present.  Cardiovascular: Normal rate, regular rhythm and normal heart sounds.   No murmur heard. Pulmonary/Chest: Effort normal and breath sounds normal. No respiratory distress. He has no wheezes. He exhibits no tenderness.  Abdominal: Soft. Bowel sounds are normal. He exhibits no mass. There is no tenderness.  Musculoskeletal: Normal range of motion. He exhibits no edema or tenderness.  Absent toenail left hallux, otherwise normal appearance of toe with no evidence of infection  Restricted range of motion of both lower extremity  Neurological: He is alert and oriented to person, place, and time.  Skin: Skin is warm and dry.  Psychiatric: He has a normal mood and affect.     Assessment & Plan:   1. Seasonal allergic rhinitis due to other allergic trigger Controlled on  loratadine and albuterol MDI   2. Screening for metabolic disorder - HDQ22+WLNL - Lipid panel  3. Acne vulgaris Continue benzoyl peroxide, clindamycin topical, Retin-A - CBC with Differential/Platelet   Meds ordered this encounter  Medications  . albuterol (PROVENTIL HFA;VENTOLIN HFA) 108 (90 Base) MCG/ACT inhaler    Sig: Inhale 2 puffs into the lungs every 6 (six) hours as needed for wheezing.    Dispense:  1 Inhaler    Refill:  6    Follow-up: Return in about 6 months (around 10/17/2017) for Follow-up chronic medical problems.   Arnoldo Morale MD

## 2017-04-17 NOTE — Progress Notes (Signed)
Pt wants to discuss Prozac medication.

## 2017-04-18 LAB — CMP14+EGFR
A/G RATIO: 1.8 (ref 1.2–2.2)
ALBUMIN: 4.8 g/dL (ref 3.5–5.5)
ALK PHOS: 53 IU/L (ref 39–117)
ALT: 191 IU/L — ABNORMAL HIGH (ref 0–44)
AST: 85 IU/L — ABNORMAL HIGH (ref 0–40)
BILIRUBIN TOTAL: 0.4 mg/dL (ref 0.0–1.2)
BUN / CREAT RATIO: 47 — AB (ref 9–20)
BUN: 16 mg/dL (ref 6–20)
CHLORIDE: 104 mmol/L (ref 96–106)
CO2: 19 mmol/L — ABNORMAL LOW (ref 20–29)
Calcium: 9.2 mg/dL (ref 8.7–10.2)
Creatinine, Ser: 0.34 mg/dL — ABNORMAL LOW (ref 0.76–1.27)
GFR calc Af Amer: 203 mL/min/{1.73_m2} (ref >59)
GFR calc non Af Amer: 175 mL/min/{1.73_m2} (ref >59)
GLOBULIN, TOTAL: 2.7 g/dL (ref 1.5–4.5)
GLUCOSE: 79 mg/dL (ref 65–99)
POTASSIUM: 4.3 mmol/L (ref 3.5–5.2)
SODIUM: 142 mmol/L (ref 134–144)
Total Protein: 7.5 g/dL (ref 6.0–8.5)

## 2017-04-18 LAB — LIPID PANEL
CHOL/HDL RATIO: 3.4 ratio (ref 0.0–5.0)
CHOLESTEROL TOTAL: 189 mg/dL (ref 100–199)
HDL: 56 mg/dL (ref >39)
LDL Calculated: 120 mg/dL — ABNORMAL HIGH (ref 0–99)
TRIGLYCERIDES: 66 mg/dL (ref 0–149)
VLDL Cholesterol Cal: 13 mg/dL (ref 5–40)

## 2017-04-18 LAB — CBC WITH DIFFERENTIAL/PLATELET
BASOS ABS: 0 10*3/uL (ref 0.0–0.2)
Basos: 0 %
EOS (ABSOLUTE): 0.1 10*3/uL (ref 0.0–0.4)
Eos: 1 %
Hematocrit: 42.8 % (ref 37.5–51.0)
Hemoglobin: 13.7 g/dL (ref 13.0–17.7)
Immature Grans (Abs): 0 10*3/uL (ref 0.0–0.1)
Immature Granulocytes: 0 %
LYMPHS ABS: 1.6 10*3/uL (ref 0.7–3.1)
Lymphs: 37 %
MCH: 27.1 pg (ref 26.6–33.0)
MCHC: 32 g/dL (ref 31.5–35.7)
MCV: 85 fL (ref 79–97)
MONOS ABS: 0.4 10*3/uL (ref 0.1–0.9)
Monocytes: 8 %
NEUTROS ABS: 2.3 10*3/uL (ref 1.4–7.0)
Neutrophils: 54 %
Platelets: 224 10*3/uL (ref 150–379)
RBC: 5.05 x10E6/uL (ref 4.14–5.80)
RDW: 14.2 % (ref 12.3–15.4)
WBC: 4.2 10*3/uL (ref 3.4–10.8)

## 2017-04-27 ENCOUNTER — Other Ambulatory Visit: Payer: Self-pay | Admitting: Family Medicine

## 2017-06-05 ENCOUNTER — Encounter: Payer: Self-pay | Admitting: Family Medicine

## 2017-06-05 ENCOUNTER — Ambulatory Visit: Payer: Medicaid Other | Attending: Family Medicine | Admitting: Family Medicine

## 2017-06-05 VITALS — BP 107/74 | HR 75 | Temp 97.5°F

## 2017-06-05 DIAGNOSIS — G129 Spinal muscular atrophy, unspecified: Secondary | ICD-10-CM | POA: Diagnosis not present

## 2017-06-05 DIAGNOSIS — R7401 Elevation of levels of liver transaminase levels: Secondary | ICD-10-CM

## 2017-06-05 DIAGNOSIS — L600 Ingrowing nail: Secondary | ICD-10-CM | POA: Insufficient documentation

## 2017-06-05 DIAGNOSIS — Z880 Allergy status to penicillin: Secondary | ICD-10-CM | POA: Insufficient documentation

## 2017-06-05 DIAGNOSIS — Z9889 Other specified postprocedural states: Secondary | ICD-10-CM | POA: Insufficient documentation

## 2017-06-05 DIAGNOSIS — R740 Nonspecific elevation of levels of transaminase and lactic acid dehydrogenase [LDH]: Secondary | ICD-10-CM | POA: Insufficient documentation

## 2017-06-05 DIAGNOSIS — M79674 Pain in right toe(s): Secondary | ICD-10-CM | POA: Diagnosis present

## 2017-06-05 MED ORDER — SULFAMETHOXAZOLE-TRIMETHOPRIM 800-160 MG PO TABS: 1.0000 | ORAL_TABLET | Freq: Two times a day (BID) | ORAL | 0 refills | Status: DC

## 2017-06-05 NOTE — Patient Instructions (Signed)
Ingrown Toenail An ingrown toenail occurs when the corner or sides of your toenail grow into the surrounding skin. The big toe is most commonly affected, but it can happen to any of your toes. If your ingrown toenail is not treated, you will be at risk for infection. What are the causes? This condition may be caused by:  Wearing shoes that are too small or tight.  Injury or trauma, such as stubbing your toe or having your toe stepped on.  Improper cutting or care of your toenails.  Being born with (congenital) nail or foot abnormalities, such as having a nail that is too big for your toe.  What increases the risk? Risk factors for an ingrown toenail include:  Age. Your nails tend to thicken as you get older, so ingrown nails are more common in older people.  Diabetes.  Cutting your toenails incorrectly.  Blood circulation problems.  What are the signs or symptoms? Symptoms may include:  Pain, soreness, or tenderness.  Redness.  Swelling.  Hardening of the skin surrounding the toe.  Your ingrown toenail may be infected if there is fluid, pus, or drainage. How is this diagnosed? An ingrown toenail may be diagnosed by medical history and physical exam. If your toenail is infected, your health care provider may test a sample of the drainage. How is this treated? Treatment depends on the severity of your ingrown toenail. Some ingrown toenails may be treated at home. More severe or infected ingrown toenails may require surgery to remove all or part of the nail. Infected ingrown toenails may also be treated with antibiotic medicines. Follow these instructions at home:  If you were prescribed an antibiotic medicine, finish all of it even if you start to feel better.  Soak your foot in warm soapy water for 20 minutes, 3 times per day or as directed by your health care provider.  Carefully lift the edge of the nail away from the sore skin by wedging a small piece of cotton under  the corner of the nail. This may help with the pain. Be careful not to cause more injury to the area.  Wear shoes that fit well. If your ingrown toenail is causing you pain, try wearing sandals, if possible.  Trim your toenails regularly and carefully. Do not cut them in a curved shape. Cut your toenails straight across. This prevents injury to the skin at the corners of the toenail.  Keep your feet clean and dry.  If you are having trouble walking and are given crutches by your health care provider, use them as directed.  Do not pick at your toenail or try to remove it yourself.  Take medicines only as directed by your health care provider.  Keep all follow-up visits as directed by your health care provider. This is important. Contact a health care provider if:  Your symptoms do not improve with treatment. Get help right away if:  You have red streaks that start at your foot and go up your leg.  You have a fever.  You have increased redness, swelling, or pain.  You have fluid, blood, or pus coming from your toenail. This information is not intended to replace advice given to you by your health care provider. Make sure you discuss any questions you have with your health care provider. Document Released: 10/05/2000 Document Revised: 03/09/2016 Document Reviewed: 09/01/2014 Elsevier Interactive Patient Education  2018 [employer]  

## 2017-06-05 NOTE — Progress Notes (Signed)
Subjective:  Patient ID: Wayne Nichols, male    DOB: November 23, 1990  Age: 26 y.o. MRN: [id number]  CC: Toe Pain   HPI EHREN BERISHA is a 26 year old male with a history of spinal muscular atrophy, acne vulgaris, seasonal allergies, anxiety and depression who presents to the clinic for an acute visit.  He complains of a 4 day history of right toe pain, sensitivity to touch and associated bleeding. Last episode of bleeding was noticed 2 days ago; denies using any OTC creams. He denies wearing tight shoes  Of note he had a left hallux abscess with subsequent loss of his left big toenail and was managed by his podiatry at [facility].   He denies fever, chills.  Review of his last set of labs indicate elevated transaminases with AST of 85 and ALT 191 with no known inciting agent. He denies alcohol consumption.   Past Medical History:  Diagnosis Date  . Hemorrhoids   . Seasonal allergies   . Spinal muscular atrophy [facility])     Past Surgical History:  Procedure Laterality Date  . BACK SURGERY    . MUSCLE BIOPSY    . TENDON RELEASE      Allergies  Allergen Reactions  . Amoxicillin Hives  . Penicillins Hives and Rash     Outpatient Medications Prior to Visit  Medication Sig Dispense Refill  . albuterol (PROVENTIL HFA;VENTOLIN HFA) 108 (90 Base) MCG/ACT inhaler Inhale 2 puffs into the lungs every 6 (six) hours as needed for wheezing. 1 Inhaler 6  . benzoyl peroxide 10 % gel Apply topically.    . clindamycin (CLEOCIN T) 1 % external solution Apply topically.    Marland Kitchen ibuprofen (ADVIL,MOTRIN) 200 MG tablet Take 200-400 mg by mouth every 6 (six) hours as needed for moderate pain.     Marland Kitchen loratadine (CLARITIN) 10 MG tablet TAKE 1 TABLET BY MOUTH DAILY AS NEEDED FOR ALLERGIES 30 tablet 2  . tretinoin (RETIN-A) 0.025 % cream Apply topically at bedtime.    . phenylephrine-shark liver oil-mineral oil-petrolatum (PREPARATION H) 0.25-3-14-71.9 % rectal ointment Place 1 application  rectally 2 (two) times daily as needed for hemorrhoids. Reported on 05/01/2016     No facility-administered medications prior to visit.     ROS Review of Systems  Constitutional: Negative for activity change and appetite change.  HENT: Negative for sinus pressure and sore throat.   Eyes: Negative for visual disturbance.  Respiratory: Negative for cough, chest tightness and shortness of breath.   Cardiovascular: Negative for chest pain and leg swelling.  Gastrointestinal: Negative for abdominal distention, abdominal pain, constipation and diarrhea.  Endocrine: Negative.   Genitourinary: Negative for dysuria.  Musculoskeletal:       See hpi  Skin: Negative for rash.  Allergic/Immunologic: Negative.   Neurological: Negative for weakness, light-headedness and numbness.  Psychiatric/Behavioral: Negative for dysphoric mood and suicidal ideas.    Objective:  BP 107/74   Pulse 75   Temp (!) 97.5 F (36.4 C) (Oral)   SpO2 99%   BP/Weight 06/05/2017 04/17/2017 63/05/4535  Systolic BP 468 032 122  Diastolic BP 74 67 74  Wt. (Lbs) - - -  BMI - - -      Physical Exam Constitutional: He is oriented to person, place, and time. He appears well-developed and well-nourished.  HENT:  Head: Normocephalic and atraumatic.  Right Ear: External ear normal.  Left Ear: External ear normal.  Eyes: Conjunctivae and EOM are normal. Pupils are equal, round, and reactive  to light.  Neck: Normal range of motion. Neck supple. No tracheal deviation present.  Cardiovascular: Normal rate, regular rhythm and normal heart sounds.   No murmur heard. Pulmonary/Chest: Effort normal and breath sounds normal. No respiratory distress. He has no wheezes. He exhibits no tenderness.  Abdominal: Soft. Bowel sounds are normal. He exhibits no mass. There is no tenderness.  Musculoskeletal:  Right hallus with edema of medial aspect of toe, associated erythema. No bleeding or purulent discharge.  CMP Latest Ref Rng &  Units 04/17/2017 05/01/2016 08/22/2015  Glucose 65 - 99 mg/dL 79 76 86  BUN 6 - 20 mg/dL '16 12 11  ' Creatinine 0.76 - 1.27 mg/dL 0.34(L) 0.28(L) 0.30(L)  Sodium 134 - 144 mmol/L 142 139 140  Potassium 3.5 - 5.2 mmol/L 4.3 4.2 4.3  Chloride 96 - 106 mmol/L 104 107 106  CO2 20 - 29 mmol/L 19(L) 23 26  Calcium 8.7 - 10.2 mg/dL 9.2 9.3 9.1  Total Protein 6.0 - 8.5 g/dL 7.5 7.1 7.0  Total Bilirubin 0.0 - 1.2 mg/dL 0.4 0.6 0.4  Alkaline Phos 39 - 117 IU/L 53 46 46  AST 0 - 40 IU/L 85(H) 24 18  ALT 0 - 44 IU/L 191(H) 40 20     Assessment & Plan:   1. Spinal muscular atrophy ([place]) Stable  2. Ingrown toenail of right foot with infection Advised on warm soaks His podiatrist is at [facility] and he has been advised to walk in to obtain an appointment - sulfamethoxazole-trimethoprim (BACTRIM DS,SEPTRA DS) 800-160 MG tablet; Take 1 tablet by mouth 2 (two) times daily.  Dispense: 20 tablet; Refill: 0  3. Transaminitis Unknown etiology - CMP14+EGFR - Acute Hep Panel & Hep B Surface Ab - Gamma GT - AFP tumor marker - Ferritin   Meds ordered this encounter  Medications  . sulfamethoxazole-trimethoprim (BACTRIM DS,SEPTRA DS) 800-160 MG tablet    Sig: Take 1 tablet by mouth 2 (two) times daily.    Dispense:  20 tablet    Refill:  0    Follow-up: Return for Follow-up of right toe, please see your podiatrist at [place].   This note has been created with Surveyor, quantity. Any transcriptional errors are unintentional.     Arnoldo Morale MD

## 2017-06-06 ENCOUNTER — Telehealth: Payer: Self-pay

## 2017-06-06 LAB — CMP14+EGFR
ALT: 125 IU/L — AB (ref 0–44)
AST: 58 IU/L — ABNORMAL HIGH (ref 0–40)
Albumin/Globulin Ratio: 1.8 (ref 1.2–2.2)
Albumin: 4.8 g/dL (ref 3.5–5.5)
Alkaline Phosphatase: 46 IU/L (ref 39–117)
BUN/Creatinine Ratio: 34 — ABNORMAL HIGH (ref 9–20)
BUN: 12 mg/dL (ref 6–20)
Bilirubin Total: 0.2 mg/dL (ref 0.0–1.2)
CALCIUM: 9.4 mg/dL (ref 8.7–10.2)
CHLORIDE: 105 mmol/L (ref 96–106)
CO2: 26 mmol/L (ref 20–29)
Creatinine, Ser: 0.35 mg/dL — ABNORMAL LOW (ref 0.76–1.27)
GFR, EST AFRICAN AMERICAN: 200 mL/min/{1.73_m2} (ref >59)
GFR, EST NON AFRICAN AMERICAN: 173 mL/min/{1.73_m2} (ref >59)
GLUCOSE: 83 mg/dL (ref 65–99)
Globulin, Total: 2.6 g/dL (ref 1.5–4.5)
POTASSIUM: 4.1 mmol/L (ref 3.5–5.2)
Sodium: 143 mmol/L (ref 134–144)
TOTAL PROTEIN: 7.4 g/dL (ref 6.0–8.5)

## 2017-06-06 LAB — FERRITIN: Ferritin: 564 ng/mL — ABNORMAL HIGH (ref 30–400)

## 2017-06-06 LAB — AFP TUMOR MARKER: AFP, SERUM, TUMOR MARKER: 2.5 ng/mL (ref 0.0–8.3)

## 2017-06-06 LAB — GAMMA GT: GGT: 35 IU/L (ref 0–65)

## 2017-06-06 LAB — ACUTE HEP PANEL AND HEP B SURFACE AB
HEP B C IGM: NEGATIVE
HEP B S AG: NEGATIVE
HEPATITIS B SURF AB QUANT: 9.5 m[IU]/mL — AB
Hep A IgM: NEGATIVE
Hep C Virus Ab: <0.1 s/co ratio (ref 0.0–0.9)

## 2017-06-06 NOTE — Telephone Encounter (Signed)
Pt was called and informed of lab results and to get lab rechecked on one month.

## 2017-06-12 ENCOUNTER — Ambulatory Visit (INDEPENDENT_AMBULATORY_CARE_PROVIDER_SITE_OTHER): Payer: Medicaid Other | Admitting: Podiatry

## 2017-06-12 DIAGNOSIS — L600 Ingrowing nail: Secondary | ICD-10-CM

## 2017-06-12 NOTE — Progress Notes (Signed)
Subjective:    Patient ID: Wayne Nichols, male   DOB: 26 y.o.   MRN: [id number]   HPI patient presents with pain in the borders of the big toenail right states he cannot take care of himself as he is in a wheelchair    ROS      Objective:  Physical Exam neurovascular status unchanged with incurvated hallux nailbeds bilateral right that's painful when palpated     Assessment:   Ingrown toenail deformity right hallux medial lateral borders      Plan:   H&P condition reviewed and recommended correction explaining procedure and risk. Today I infiltrated the right hallux 60 Milligan times like Marcaine mixture remove the medial and lateral borders exposed matrix and applied phenol 3 applications 30 seconds followed by alcohol lavaged sterile dressing. Gave instructions on soaks and reappoint

## 2017-06-12 NOTE — Patient Instructions (Signed)

## 2017-09-26 ENCOUNTER — Ambulatory Visit (INDEPENDENT_AMBULATORY_CARE_PROVIDER_SITE_OTHER): Payer: Medicaid Other

## 2017-09-26 ENCOUNTER — Encounter: Payer: Self-pay | Admitting: Podiatry

## 2017-09-26 ENCOUNTER — Other Ambulatory Visit: Payer: Self-pay | Admitting: Podiatry

## 2017-09-26 ENCOUNTER — Ambulatory Visit (INDEPENDENT_AMBULATORY_CARE_PROVIDER_SITE_OTHER): Payer: Medicaid Other | Admitting: Podiatry

## 2017-09-26 DIAGNOSIS — M779 Enthesopathy, unspecified: Secondary | ICD-10-CM | POA: Diagnosis not present

## 2017-09-26 DIAGNOSIS — L03032 Cellulitis of left toe: Secondary | ICD-10-CM

## 2017-09-26 NOTE — Addendum Note (Signed)
Addended by: Fritz PickerelMAVRAKIS, Melah Ebling A on: 09/26/2017 05:09 PM   Modules accepted: Orders

## 2017-09-26 NOTE — Progress Notes (Signed)
Subjective:   Patient ID: Wayne ConesPatrick I Nichols, male   DOB: 26 y.o.   MRN: [id number]   HPI Patient presents concerned about an inflamed area of the left big toe and states he was doing fine for couple months and is just started in the last couple weeks.   ROS      Objective:  Physical Exam  Neurovascular status unchanged with patient's left hallux lateral nail showing slight crusted tissue with localized irritation but no acute drainage or anything along those lines     Assessment:  Appears to be a localized inflammatory process with crusted tissue     Plan:  I reviewed condition at this point I clean the area out will allow it to drain and I gave him strict instructions of any increase in redness throbbing or any other pathology were to occur that I want the patient to be seen immediately.  Patient will do this and will be seen back as needed and I am hoping that this will be an uneventful recovery

## 2017-11-20 IMAGING — DX DG NECK SOFT TISSUE
2 series · 2 of 2 positions shown · non-contrast
Comparison: None.

CLINICAL DATA: Pt states he feels like something is caught in his
throat, says he can eat but it feels like something is in there PT
IN A WHEELCHAIR VIEWS DONE ON THE TABLE, CHEST BOARD NOT WORKING

EXAM:
NECK SOFT TISSUES - 1+ VIEW

[neck lat]
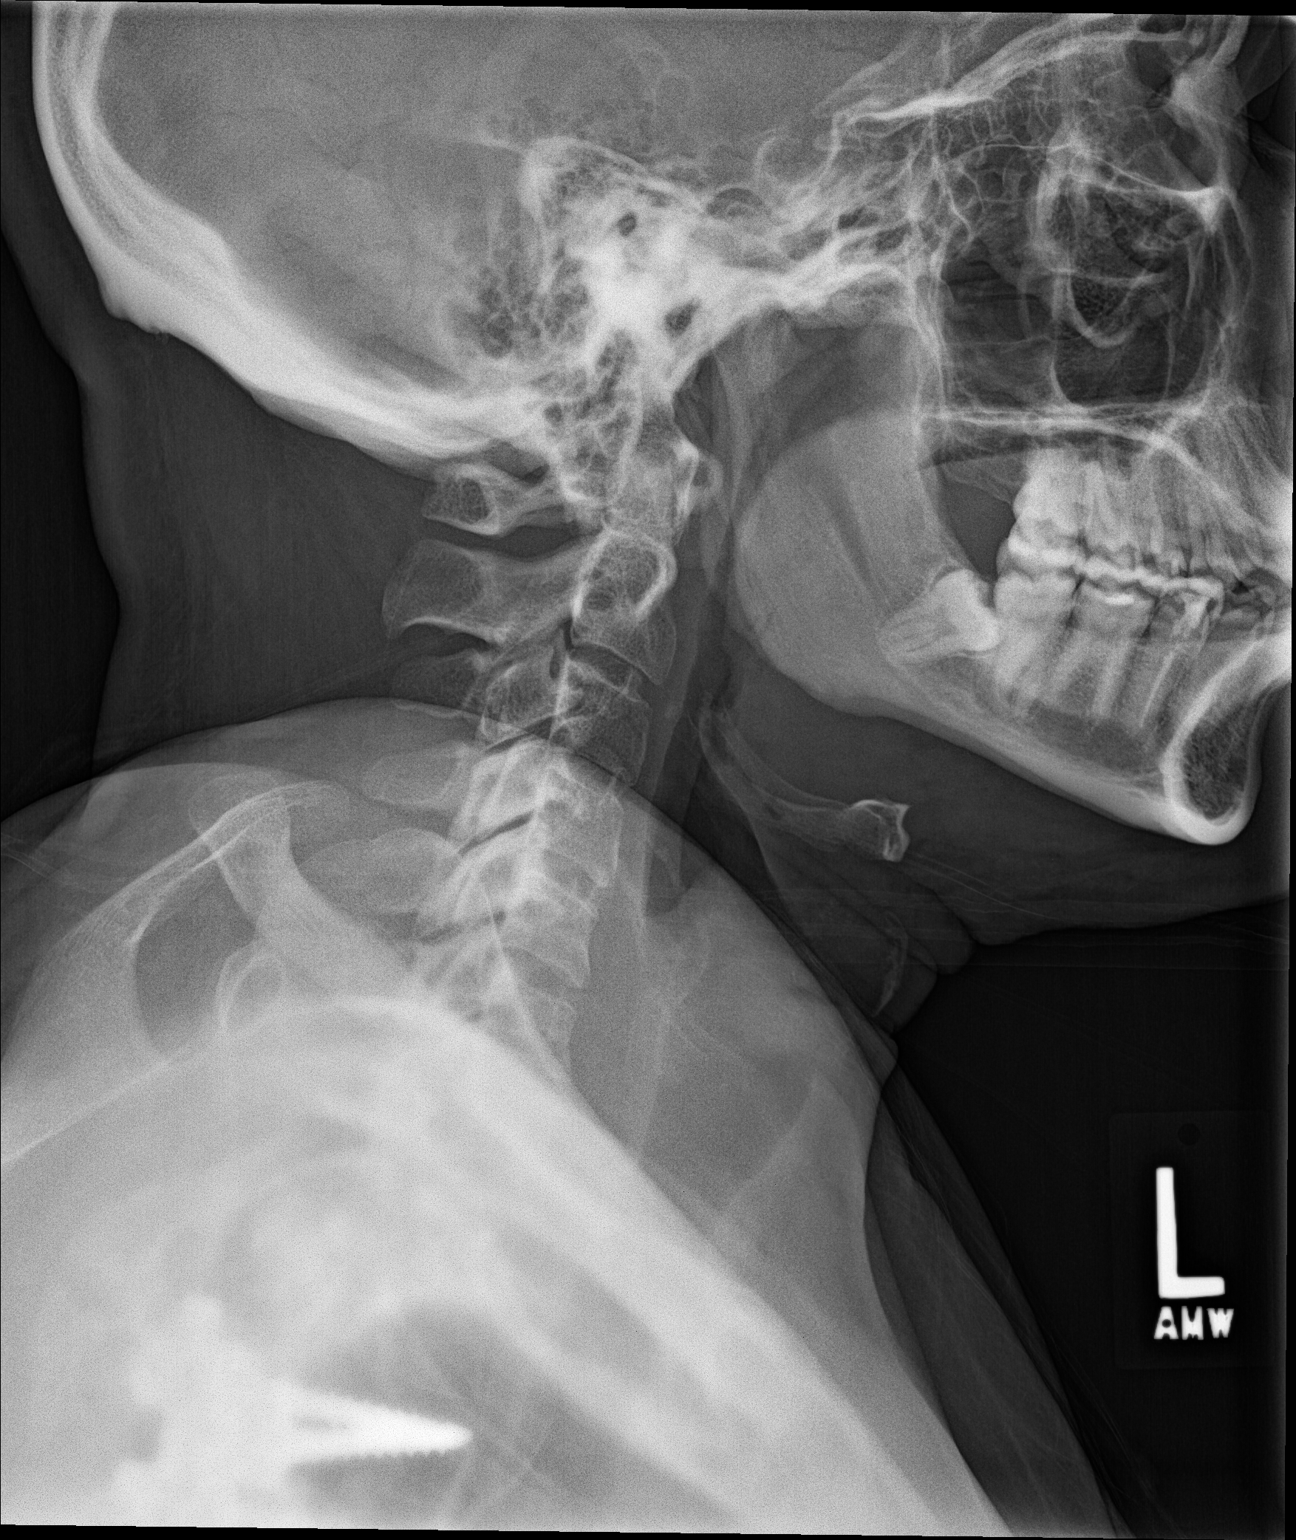

[neck ap]
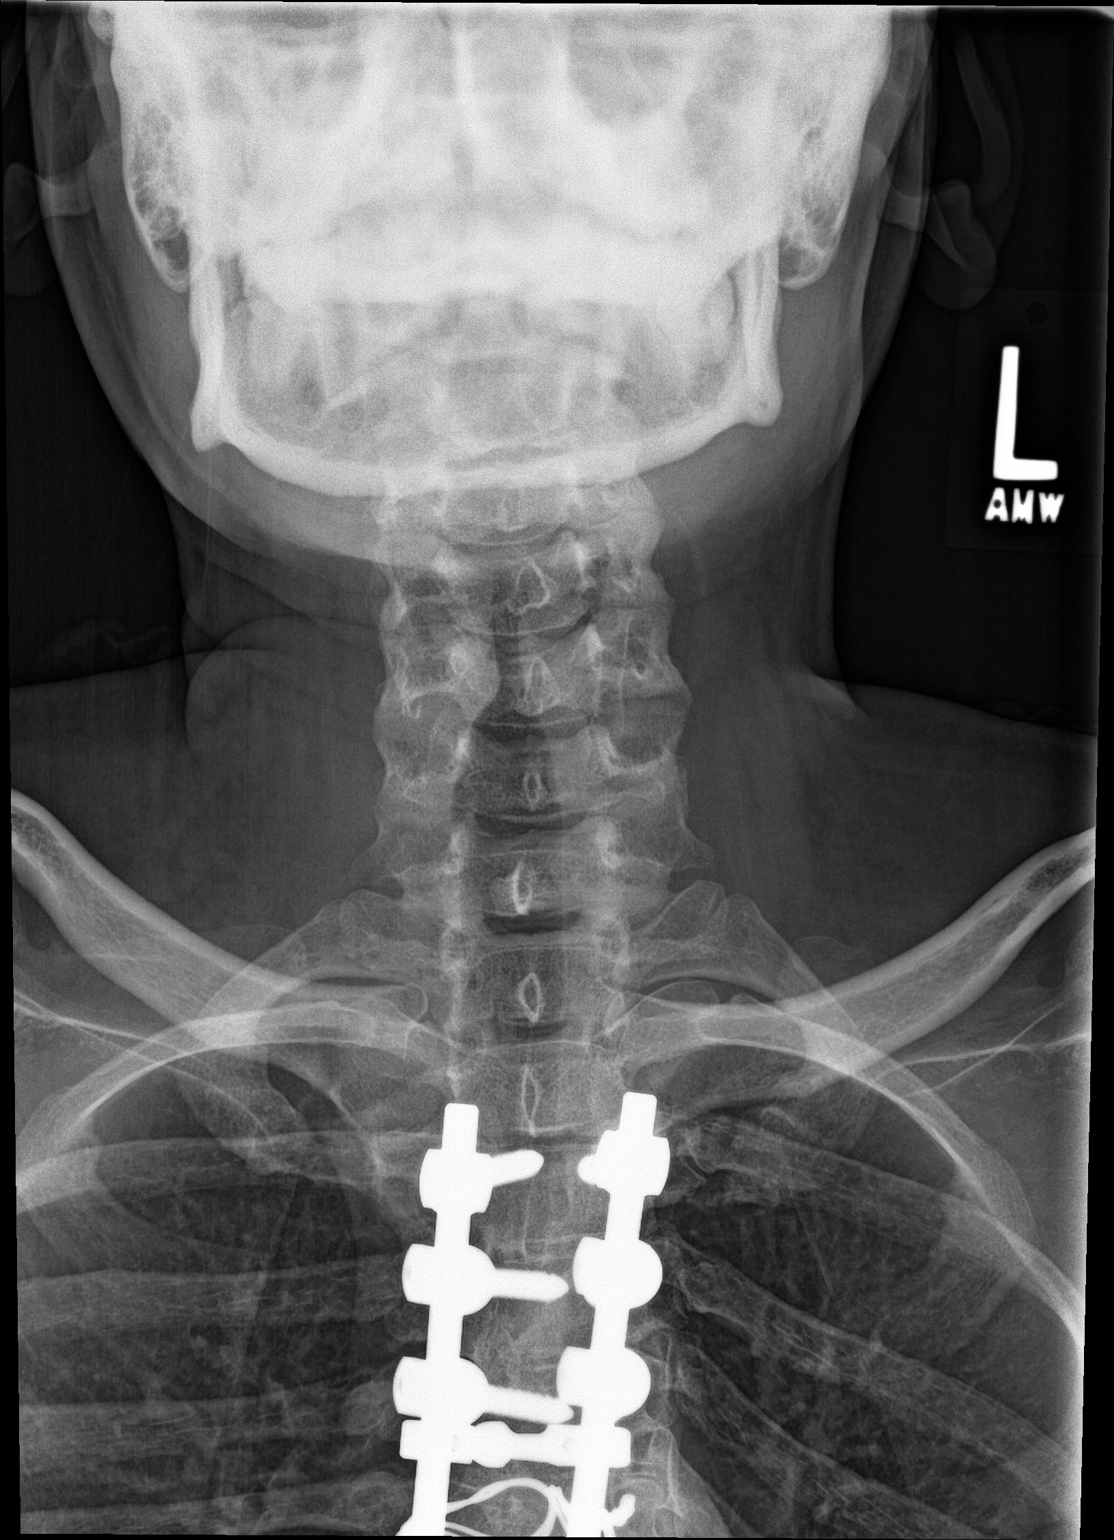

[2 of 2 positions shown; findings below may reference images not displayed]

FINDINGS: There is no evidence of retropharyngeal soft tissue swelling or
epiglottic enlargement. The cervical airway is unremarkable and no
radio-opaque foreign body identified. Thoracic fixation hardware is
partially visualized.
IMPRESSION: Negative.

## 2017-11-21 ENCOUNTER — Other Ambulatory Visit: Payer: Self-pay | Admitting: Family Medicine

## 2018-01-08 DIAGNOSIS — G8250 Quadriplegia, unspecified: Secondary | ICD-10-CM | POA: Diagnosis not present

## 2018-01-08 DIAGNOSIS — G129 Spinal muscular atrophy, unspecified: Secondary | ICD-10-CM | POA: Diagnosis not present

## 2018-01-27 ENCOUNTER — Other Ambulatory Visit: Payer: Self-pay | Admitting: Family Medicine

## 2018-03-24 ENCOUNTER — Other Ambulatory Visit: Payer: Self-pay | Admitting: Family Medicine

## 2018-03-29 ENCOUNTER — Ambulatory Visit (HOSPITAL_COMMUNITY)
Admission: EM | Admit: 2018-03-29 | Discharge: 2018-03-29 | Disposition: A | Discharge status: Home / Self Care | Payer: Medicaid Other | Attending: Family | Admitting: Family

## 2018-03-29 ENCOUNTER — Encounter (HOSPITAL_COMMUNITY): Payer: Self-pay

## 2018-03-29 DIAGNOSIS — M533 Sacrococcygeal disorders, not elsewhere classified: Secondary | ICD-10-CM | POA: Diagnosis not present

## 2018-03-29 MED ORDER — PREDNISONE 10 MG PO TABS: ORAL_TABLET | ORAL | 0 refills | Status: DC

## 2018-03-29 MED ORDER — LIDOCAINE 5 % EX PTCH: 1.0000 | MEDICATED_PATCH | CUTANEOUS | 0 refills | Status: DC

## 2018-03-29 MED ORDER — CYCLOBENZAPRINE HCL 5 MG PO TABS: 5.0000 mg | ORAL_TABLET | Freq: Every evening | ORAL | 0 refills | Status: DC | PRN

## 2018-03-29 NOTE — ED Provider Notes (Signed)
[facility]    CSN: [id number] Arrival date & time: 03/29/18  1511     History   Chief Complaint Chief Complaint  Patient presents with  . Back Pain    HPI Wayne Nichols is a 27 y.o. male.   Chief complaint low back pain, 5 days, unchanged. At work, first noticed. No injury. A little 'more present' which is why here today. Notes more left sided,  In his buttocks.  Worse when bends to the right. No numbness, shooting pain in legs. Urinating okay.  No h/o renal stones. No n, v, fever, flank pain, hematuria. Aleve is not helping. No rash.   Patient follows with neurology for spinal muscular atrophy, Dr. Collene Leydenares.  Last visit February of this year.  He is wheelchair bound History of lumbar spinal fusion 2008. Occasional, social alcohol use.      Past Medical History:  Diagnosis Date  . Hemorrhoids   . Seasonal allergies   . Spinal muscular atrophy [facility](HCC)     Patient Active Problem List   Diagnosis Date Noted  . Seasonal allergies 04/17/2017  . Acne 05/01/2016  . Anxiety and depression 05/01/2016  . Muscular atrophy, spinal (HCC) 03/01/2015  . Spinal muscular atrophy (HCC) 10/08/2011    Past Surgical History:  Procedure Laterality Date  . BACK SURGERY    . MUSCLE BIOPSY    . TENDON RELEASE         Home Medications    Prior to Admission medications   Medication Sig Start Date End Date Taking? Authorizing Provider  benzoyl peroxide 10 % gel Apply topically.   Yes [provider]  clindamycin (CLEOCIN T) 1 % external solution Apply topically.   Yes [provider]  loratadine (CLARITIN) 10 MG tablet TAKE 1 TABLET BY MOUTH DAILY AS NEEDED FOR ALLERGIES 03/24/18  Yes Newlin, Enobong, MD  tretinoin (RETIN-A) 0.025 % cream Apply topically at bedtime.   Yes [provider]  albuterol (PROVENTIL HFA;VENTOLIN HFA) 108 (90 Base) MCG/ACT inhaler Inhale 2 puffs into the lungs every 6 (six) hours as needed for wheezing. 04/17/17    Hoy RegisterNewlin, Enobong, MD  cyclobenzaprine (FLEXERIL) 5 MG tablet Take 1 tablet (5 mg total) by mouth at bedtime as needed for muscle spasms. 03/29/18   Allegra GranaArnett, Margaret G, FNP  ibuprofen (ADVIL,MOTRIN) 200 MG tablet Take 200-400 mg by mouth every 6 (six) hours as needed for moderate pain.     [provider]  lidocaine (LIDODERM) 5 % Place 1 patch onto the skin daily. Remove & Discard patch within 12 hours. 03/29/18   Allegra GranaArnett, Margaret G, FNP  phenylephrine-shark liver oil-mineral oil-petrolatum (PREPARATION H) 0.25-3-14-71.9 % rectal ointment Place 1 application rectally 2 (two) times daily as needed for hemorrhoids. Reported on 05/01/2016    [provider]  predniSONE (DELTASONE) 10 MG tablet Take 40 mg by mouth on day 1, then taper 10 mg daily until gone 03/29/18   Allegra GranaArnett, Margaret G, FNP  sulfamethoxazole-trimethoprim (BACTRIM DS,SEPTRA DS) 800-160 MG tablet Take 1 tablet by mouth 2 (two) times daily. 06/05/17   Hoy RegisterNewlin, Enobong, MD    Family History Family History  Problem Relation Age of Onset  . Stroke Maternal Grandmother   . Heart disease Maternal Grandmother     Social History Social History   Tobacco Use  . Smoking status: Never Smoker  . Smokeless tobacco: Never Used  Substance Use Topics  . Alcohol use: Yes    Alcohol/week: 0.0 oz    Comment: Occasional  .  Drug use: No     Allergies   Amoxicillin and Penicillins   Review of Systems Review of Systems  Constitutional: Negative for chills and fever.  Respiratory: Negative for cough.   Cardiovascular: Negative for chest pain and palpitations.  Gastrointestinal: Negative for nausea and vomiting.  Musculoskeletal: Positive for back pain.  Neurological: Positive for weakness (chronic, LE).     Physical Exam Triage Vital Signs ED Triage Vitals [03/29/18 1542]  Enc Vitals Group     BP 122/79     Pulse Rate 77     Resp 18     Temp 98.6 F (37 C)     Temp src      SpO2 97 %     Weight      Height       Head Circumference      Peak Flow      Pain Score 7     Pain Loc      Pain Edu?      Excl. in GC?    No data found.  Updated Vital Signs BP 122/79   Pulse 77   Temp 98.6 F (37 C)   Resp 18   SpO2 97%   Visual Acuity Right Eye Distance:   Left Eye Distance:   Bilateral Distance:    Right Eye Near:   Left Eye Near:    Bilateral Near:     Physical Exam  Constitutional: He appears well-developed and well-nourished.  Cardiovascular: Regular rhythm and normal heart sounds.  Pulmonary/Chest: Effort normal and breath sounds normal. No respiratory distress. He has no wheezes. He has no rhonchi. He has no rales.  Abdominal: There is no CVA tenderness.  Musculoskeletal:       Lumbar back: He exhibits pain. He exhibits normal range of motion, no tenderness, no swelling and no spasm.       Back:  Full range of motion with flexion, extension, lateral side bends. No pain, numbness, tingling elicited with single leg raise bilaterally. No rash. Pain over left SI joint.    Lymphadenopathy:       Head (left side): No submandibular and no preauricular adenopathy present.  Neurological: He is alert.  Skin: Skin is warm and dry.  Psychiatric: He has a normal mood and affect. His speech is normal and behavior is normal.  Vitals reviewed.    UC Treatments / Results  Labs (all labs ordered are listed, but only abnormal results are displayed) Labs Reviewed - No data to display  EKG None  Radiology No results found.  Procedures Procedures (including critical care time)  Medications Ordered in UC Medications - No data to display  Initial Impression / Assessment and Plan / UC Course  I have reviewed the triage vital signs and the nursing notes.  Pertinent labs & imaging results that were available during my care of the patient were reviewed by me and considered in my medical decision making (see chart for details).      Final Clinical Impressions(s) / UC Diagnoses    Final diagnoses:  Sacroiliac joint dysfunction  Patient is well-appearing, no acute distress.  No urinary complaints or CVA tenderness.  Differential includes SI joint dysfunction, muscle spasm.  Reasonable to treat conservatively at this gesture.  Advised muscle relaxant at bedtime this evening.  He may start prednisone taper tomorrow.  He understands to follow-up with his neurologist, PCP if pain recurs, or becomes persistent. Return Precautions given.   Discharge Instructions  As discussed, I suspect sacroiliac joint dysfunction.  May also have component of sciatica.  It is reasonable given try muscle relaxant.  Please take this at bedtime as discussed.  May start prednisone taper tomorrow Lidocaine patches as needed Do not drive or operate heavy machinery while on muscle relaxant. Please do not drink alcohol. Only take this medication as needed for acute muscle spasm at bedtime. This medication make you feel drowsy so be very careful.  Stop taking if become too drowsy or somnolent as this puts you at risk for falls.  Please follow up with with primary and neurologist.      ED Prescriptions    Medication Sig Dispense Auth. Provider   cyclobenzaprine (FLEXERIL) 5 MG tablet Take 1 tablet (5 mg total) by mouth at bedtime as needed for muscle spasms. 15 tablet Allegra Grana, FNP   predniSONE (DELTASONE) 10 MG tablet Take 40 mg by mouth on day 1, then taper 10 mg daily until gone 10 tablet Arnett, Lyn Records, FNP   lidocaine (LIDODERM) 5 % Place 1 patch onto the skin daily. Remove & Discard patch within 12 hours. 30 patch Allegra Grana, FNP     Controlled Substance Prescriptions [place] Controlled Substance Registry consulted? Not Applicable   Allegra Grana, FNP 03/29/18 1902

## 2018-03-29 NOTE — Discharge Instructions (Addendum)
As discussed, I suspect sacroiliac joint dysfunction.  May also have component of sciatica.  It is reasonable given try muscle relaxant.  Please take this at bedtime as discussed.  May start prednisone taper tomorrow Lidocaine patches as needed Do not drive or operate heavy machinery while on muscle relaxant. Please do not drink alcohol. Only take this medication as needed for acute muscle spasm at bedtime. This medication make you feel drowsy so be very careful.  Stop taking if become too drowsy or somnolent as this puts you at risk for falls.  Please follow up with with primary and neurologist.

## 2018-03-29 NOTE — ED Triage Notes (Signed)
Pt presents with complaints of pain in his left lower back. States it feels like nerve pain. Pt reports this is not normal. He is wheelchair bound.

## 2018-05-15 ENCOUNTER — Other Ambulatory Visit: Payer: Self-pay | Admitting: Family Medicine

## 2018-06-13 ENCOUNTER — Other Ambulatory Visit: Payer: Self-pay | Admitting: Family Medicine

## 2018-06-18 ENCOUNTER — Other Ambulatory Visit: Payer: Self-pay | Admitting: Family Medicine

## 2018-06-18 DIAGNOSIS — L700 Acne vulgaris: Secondary | ICD-10-CM | POA: Diagnosis not present

## 2018-06-25 DIAGNOSIS — G129 Spinal muscular atrophy, unspecified: Secondary | ICD-10-CM | POA: Diagnosis not present

## 2018-06-25 DIAGNOSIS — G8250 Quadriplegia, unspecified: Secondary | ICD-10-CM | POA: Diagnosis not present

## 2018-06-28 ENCOUNTER — Other Ambulatory Visit: Payer: Self-pay | Admitting: Family Medicine

## 2018-07-01 DIAGNOSIS — Z79899 Other long term (current) drug therapy: Secondary | ICD-10-CM | POA: Diagnosis not present

## 2018-07-01 DIAGNOSIS — G129 Spinal muscular atrophy, unspecified: Secondary | ICD-10-CM | POA: Diagnosis not present

## 2018-07-01 DIAGNOSIS — Z981 Arthrodesis status: Secondary | ICD-10-CM | POA: Diagnosis not present

## 2018-07-01 DIAGNOSIS — G121 Other inherited spinal muscular atrophy: Secondary | ICD-10-CM | POA: Diagnosis not present

## 2018-07-12 ENCOUNTER — Ambulatory Visit (HOSPITAL_COMMUNITY)
Admission: EM | Admit: 2018-07-12 | Discharge: 2018-07-12 | Disposition: A | Discharge status: Home / Self Care | Payer: Medicaid Other | Attending: Family Medicine | Admitting: Family Medicine

## 2018-07-12 ENCOUNTER — Encounter (HOSPITAL_COMMUNITY): Payer: Self-pay | Admitting: Family Medicine

## 2018-07-12 DIAGNOSIS — J40 Bronchitis, not specified as acute or chronic: Secondary | ICD-10-CM | POA: Diagnosis not present

## 2018-07-12 MED ORDER — PREDNISONE 20 MG PO TABS: ORAL_TABLET | ORAL | 0 refills | Status: DC

## 2018-07-12 NOTE — ED Provider Notes (Signed)
[facility]    CSN: [id number] Arrival date & time: 07/12/18  1355     History   Chief Complaint Chief Complaint  Patient presents with  . URI    HPI Wayne Nichols is a 27 y.o. male.   This very pleasant 27 year old man with spinal muscular atrophy type III has developed a cough over the last week.  He has been using albuterol inhaler with minimal improvement.  He is also been using loratadine.  He states that he does not have tightness in his chest, but rather a persistent cough which is worse at night.  He has had the same symptom in the past during the fall.  Patient has known allergies with eyes watering in the fall of the year.      Neurology note from [facility] 9/10: Wayne Nichols is a 27 y.o. male with a history of SMA 3. He developed symptoms around age 13. He was ambulatory early on in elementary school but began using a wheelchair in 5th grade. 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 He is s/p posterior spinal fusion from T3 to his pelvis secondary to severe scoliosis and bilateral hamstring release in June of 2009. He had a restrictive pattern on PFTs but was 74% FVC in 2018. 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  Current status 07/01/18: Wayne Nichols presents for follow up. He was last seen in the clinic in February 2019. Overall, his symptoms have been stable since that time. He has been taking mestinon a few times per week for his neck weakness. He does think it helps improve his symptoms. Overall, he thinks that his neck weakness is actually a little bit better. He denies any new issues with swallowing or chewing. Symptoms in the arms/hands are similar. He is still able to use his phone without difficulty but is  unable to lift his arms above his head (which is not new). He does not have any respiratory concerns. He is able to lie flat without difficulty. He is not ambulatory and uses a power wheelchair at all times. He is interested in obtained a manual wheelchair.  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      Past Medical History:  Diagnosis Date  . Hemorrhoids   . Seasonal allergies   . Spinal muscular atrophy [facility])     Patient Active Problem List   Diagnosis Date Noted  . Seasonal allergies 04/17/2017  . Acne 05/01/2016  . Anxiety and depression 05/01/2016  . Muscular atrophy, spinal (HCC) 03/01/2015  . Spinal muscular atrophy (HCC) 10/08/2011    Past Surgical History:  Procedure Laterality Date  . BACK SURGERY    . MUSCLE BIOPSY    . TENDON RELEASE         Home Medications    Prior to Admission medications   Medication Sig Start Date End Date Taking? Authorizing Provider  albuterol (PROVENTIL HFA;VENTOLIN HFA) 108 (90 Base) MCG/ACT inhaler Inhale 2 puffs into the lungs every 6 (six) hours as needed for wheezing. 04/17/17   Hoy Register, MD  benzoyl peroxide 10 % gel Apply topically.    [provider]  clindamycin (CLEOCIN T) 1 % external solution  Apply topically.    [provider]  cyclobenzaprine (FLEXERIL) 5 MG tablet Take 1 tablet (5 mg total) by mouth at bedtime as needed for muscle spasms. 03/29/18   Allegra GranaArnett, Margaret G, FNP  ibuprofen (ADVIL,MOTRIN) 200 MG tablet Take 200-400 mg by mouth every 6 (six) hours as needed for moderate pain.     [provider]  lidocaine (LIDODERM) 5 % Place 1 patch onto the skin daily. Remove & Discard patch within 12 hours. 03/29/18   Allegra GranaArnett, Margaret G, FNP  loratadine (CLARITIN) 10 MG tablet TAKE 1 TABLET BY MOUTH DAILY AS NEEDED FOR ALLERGIES 05/15/18   Hoy RegisterNewlin, Enobong, MD  phenylephrine-shark liver oil-mineral oil-petrolatum (PREPARATION H) 0.25-3-14-71.9 % rectal  ointment Place 1 application rectally 2 (two) times daily as needed for hemorrhoids. Reported on 05/01/2016    [provider]  predniSONE (DELTASONE) 20 MG tablet Two daily with food 07/12/18   Elvina SidleLauenstein, Pryce Folts, MD  sulfamethoxazole-trimethoprim (BACTRIM DS,SEPTRA DS) 800-160 MG tablet Take 1 tablet by mouth 2 (two) times daily. 06/05/17   Hoy RegisterNewlin, Enobong, MD  tretinoin (RETIN-A) 0.025 % cream Apply topically at bedtime.    [provider]    Family History Family History  Problem Relation Age of Onset  . Stroke Maternal Grandmother   . Heart disease Maternal Grandmother     Social History Social History   Tobacco Use  . Smoking status: Never Smoker  . Smokeless tobacco: Never Used  Substance Use Topics  . Alcohol use: Yes    Alcohol/week: 0.0 standard drinks    Comment: Occasional  . Drug use: No     Allergies   Amoxicillin and Penicillins   Review of Systems Review of Systems   Physical Exam Triage Vital Signs ED Triage Vitals  Enc Vitals Group     BP      Pulse      Resp      Temp      Temp src      SpO2      Weight      Height      Head Circumference      Peak Flow      Pain Score      Pain Loc      Pain Edu?      Excl. in GC?    No data found.  Updated Vital Signs BP 115/77 (BP Location: Left Arm)   Pulse 80   Temp 97.9 F (36.6 C) (Oral)   Resp 20   SpO2 98%    Physical Exam  Constitutional: He is oriented to person, place, and time.  Patient has muscle wasting in both upper extremities.  He is wheelchair-bound  HENT:  Right Ear: External ear normal.  Left Ear: External ear normal.  Mouth/Throat: Oropharynx is clear and moist.  Patient pharynx is poorly visualized.  He has braces  Eyes: Pupils are equal, round, and reactive to light. Conjunctivae and EOM are normal.  Neck: Normal range of motion. Neck supple.  Cardiovascular: Normal rate, regular rhythm and normal heart sounds.  Pulmonary/Chest: Effort normal and breath  sounds normal.  Neurological: He is alert and oriented to person, place, and time.  Week hand grasps  Skin: Skin is warm and dry.  Nursing note and vitals reviewed.    UC Treatments / Results  Labs (all labs ordered are listed, but only abnormal results are displayed) Labs Reviewed - No data to display  EKG None  Radiology No results found.  Procedures  Procedures (including critical care time)  Medications Ordered in UC Medications - No data to display  Initial Impression / Assessment and Plan / UC Course  I have reviewed the triage vital signs and the nursing notes.  Pertinent labs & imaging results that were available during my care of the patient were reviewed by me and considered in my medical decision making (see chart for details).    Final Clinical Impressions(s) / UC Diagnoses   Final diagnoses:  Bronchitis   Discharge Instructions   None    ED Prescriptions    Medication Sig Dispense Auth. Provider   predniSONE (DELTASONE) 20 MG tablet Two daily with food 10 tablet Elvina Sidle, MD     Controlled Substance Prescriptions [place] Controlled Substance Registry consulted? Not Applicable   Elvina Sidle, MD 07/12/18 [phone]

## 2018-07-12 NOTE — ED Triage Notes (Signed)
Pt presents with ongoing cold symptoms; congestion, nasal drainage and persistent cough.

## 2018-07-12 NOTE — ED Notes (Signed)
Bed: UC01 Expected date:  Expected time:  Means of arrival:  Comments: Held for appts

## 2018-09-10 DIAGNOSIS — G129 Spinal muscular atrophy, unspecified: Secondary | ICD-10-CM | POA: Diagnosis not present

## 2018-09-10 DIAGNOSIS — G8250 Quadriplegia, unspecified: Secondary | ICD-10-CM | POA: Diagnosis not present

## 2018-10-23 ENCOUNTER — Other Ambulatory Visit: Payer: Self-pay | Admitting: Family Medicine

## 2018-12-03 ENCOUNTER — Emergency Department (HOSPITAL_COMMUNITY)
Admission: EM | Admit: 2018-12-03 | Discharge: 2018-12-03 | Disposition: A | Discharge status: Home / Self Care | Payer: Medicaid Other | Attending: Emergency Medicine | Admitting: Emergency Medicine

## 2018-12-03 ENCOUNTER — Encounter (HOSPITAL_COMMUNITY): Payer: Self-pay | Admitting: Emergency Medicine

## 2018-12-03 ENCOUNTER — Other Ambulatory Visit: Payer: Self-pay

## 2018-12-03 ENCOUNTER — Emergency Department (HOSPITAL_COMMUNITY): Payer: Medicaid Other

## 2018-12-03 DIAGNOSIS — F329 Major depressive disorder, single episode, unspecified: Secondary | ICD-10-CM | POA: Diagnosis not present

## 2018-12-03 DIAGNOSIS — J111 Influenza due to unidentified influenza virus with other respiratory manifestations: Secondary | ICD-10-CM | POA: Insufficient documentation

## 2018-12-03 DIAGNOSIS — Z79899 Other long term (current) drug therapy: Secondary | ICD-10-CM | POA: Insufficient documentation

## 2018-12-03 DIAGNOSIS — F419 Anxiety disorder, unspecified: Secondary | ICD-10-CM | POA: Diagnosis not present

## 2018-12-03 DIAGNOSIS — R6889 Other general symptoms and signs: Secondary | ICD-10-CM

## 2018-12-03 DIAGNOSIS — R05 Cough: Secondary | ICD-10-CM | POA: Diagnosis not present

## 2018-12-03 DIAGNOSIS — R6883 Chills (without fever): Secondary | ICD-10-CM | POA: Diagnosis present

## 2018-12-03 NOTE — ED Provider Notes (Signed)
[facility] Provider Note   CSN: [id number] Arrival date & time: 12/03/18  1742     History   Chief Complaint Chief Complaint  Patient presents with  . Flu like S/S    HPI Wayne Nichols is a 28 y.o. male with history of spinal muscular atrophy who presents with a 4-day history of chills, body aches, cough, congestion.  He reports having some nausea and diarrhea the first day, however that has resolved.  He denies any abdominal pain.  He denies any significant shortness of breath and states that his spinal muscular atrophy affects his breathing at times.  He denies any chest pain.  He denies any documented fever.  He has taken over-the-counter medications with some relief.  He has been feeling a little bit improved, however continues to have productive cough with clear sputum.  He has been around his stepfather who was tested positive for the flu.   HPI  Past Medical History:  Diagnosis Date  . Hemorrhoids   . Seasonal allergies   . Spinal muscular atrophy [facility](HCC)     Patient Active Problem List   Diagnosis Date Noted  . Seasonal allergies 04/17/2017  . Acne 05/01/2016  . Anxiety and depression 05/01/2016  . Muscular atrophy, spinal (HCC) 03/01/2015  . Spinal muscular atrophy (HCC) 10/08/2011    Past Surgical History:  Procedure Laterality Date  . BACK SURGERY    . MUSCLE BIOPSY    . TENDON RELEASE          Home Medications    Prior to Admission medications   Medication Sig Start Date End Date Taking? Authorizing Provider  albuterol (PROVENTIL HFA;VENTOLIN HFA) 108 (90 Base) MCG/ACT inhaler Inhale 2 puffs into the lungs every 6 (six) hours as needed for wheezing. 04/17/17   Hoy RegisterNewlin, Enobong, MD  benzoyl peroxide 10 % gel Apply topically.    [provider]  clindamycin (CLEOCIN T) 1 % external solution Apply topically.    [provider]  cyclobenzaprine (FLEXERIL) 5 MG tablet Take 1 tablet (5 mg total) by  mouth at bedtime as needed for muscle spasms. 03/29/18   Allegra GranaArnett, Margaret G, FNP  ibuprofen (ADVIL,MOTRIN) 200 MG tablet Take 200-400 mg by mouth every 6 (six) hours as needed for moderate pain.     [provider]  lidocaine (LIDODERM) 5 % Place 1 patch onto the skin daily. Remove & Discard patch within 12 hours. 03/29/18   Allegra GranaArnett, Margaret G, FNP  loratadine (CLARITIN) 10 MG tablet TAKE 1 TABLET BY MOUTH DAILY AS NEEDED FOR ALLERGIES 05/15/18   Hoy RegisterNewlin, Enobong, MD  phenylephrine-shark liver oil-mineral oil-petrolatum (PREPARATION H) 0.25-3-14-71.9 % rectal ointment Place 1 application rectally 2 (two) times daily as needed for hemorrhoids. Reported on 05/01/2016    [provider]  predniSONE (DELTASONE) 20 MG tablet Two daily with food 07/12/18   Elvina SidleLauenstein, Kurt, MD  sulfamethoxazole-trimethoprim (BACTRIM DS,SEPTRA DS) 800-160 MG tablet Take 1 tablet by mouth 2 (two) times daily. 06/05/17   Hoy RegisterNewlin, Enobong, MD  tretinoin (RETIN-A) 0.025 % cream Apply topically at bedtime.    [provider]    Family History Family History  Problem Relation Age of Onset  . Stroke Maternal Grandmother   . Heart disease Maternal Grandmother     Social History Social History   Tobacco Use  . Smoking status: Never Smoker  . Smokeless tobacco: Never Used  Substance Use Topics  . Alcohol use: Yes    Alcohol/week: 0.0 standard  drinks    Comment: Occasional  . Drug use: No     Allergies   Amoxicillin and Penicillins   Review of Systems Review of Systems  Constitutional: Positive for chills. Negative for fever.  HENT: Positive for congestion and sore throat (with coughing only). Negative for facial swelling.   Respiratory: Positive for cough. Negative for shortness of breath.   Cardiovascular: Negative for chest pain.  Gastrointestinal: Positive for diarrhea (resolved) and nausea. Negative for abdominal pain and vomiting. Rectal pain: resolved.  Genitourinary: Negative for  dysuria.  Musculoskeletal: Positive for myalgias. Negative for back pain.  Skin: Negative for rash and wound.  Neurological: Negative for headaches.  Psychiatric/Behavioral: The patient is not nervous/anxious.      Physical Exam Updated Vital Signs BP 112/68   Pulse 86   Temp 97.7 F (36.5 C) (Oral)   Resp 16   SpO2 98%   Physical Exam Vitals signs and nursing note reviewed.  Constitutional:      General: He is not in acute distress.    Appearance: He is well-developed. He is not diaphoretic.  HENT:     Head: Normocephalic and atraumatic.     Mouth/Throat:     Pharynx: No oropharyngeal exudate.  Eyes:     General: No scleral icterus.       Right eye: No discharge.        Left eye: No discharge.     Conjunctiva/sclera: Conjunctivae normal.     Pupils: Pupils are equal, round, and reactive to light.  Neck:     Musculoskeletal: Normal range of motion and neck supple.     Thyroid: No thyromegaly.  Cardiovascular:     Rate and Rhythm: Normal rate and regular rhythm.     Heart sounds: Normal heart sounds. No murmur. No friction rub. No gallop.   Pulmonary:     Effort: Pulmonary effort is normal. No respiratory distress.     Breath sounds: No stridor. Decreased breath sounds present. No wheezing or rales.  Abdominal:     General: Bowel sounds are normal. There is no distension.     Palpations: Abdomen is soft.     Tenderness: There is no abdominal tenderness. There is no guarding or rebound.  Lymphadenopathy:     Cervical: No cervical adenopathy.  Skin:    General: Skin is warm and dry.     Coloration: Skin is not pale.     Findings: No rash.  Neurological:     Mental Status: He is alert.     Coordination: Coordination normal.      ED Treatments / Results  Labs (all labs ordered are listed, but only abnormal results are displayed) Labs Reviewed - No data to display  EKG None  Radiology Dg Chest 2 View  Result Date: 12/03/2018 CLINICAL DATA:  28 year old  male with cough and nasal congestion EXAM: CHEST - 2 VIEW COMPARISON:  02/13/2013, 11/25/2009 FINDINGS: Cardiomediastinal silhouette unchanged in size and contour. No evidence of central vascular congestion. No pneumothorax or pleural effusion. No confluent airspace disease. Similar appearance of coarsened interstitial markings. Surgical changes of prior thoracolumbar fixation with mild persisting curvature. No acute displaced fracture. IMPRESSION: Negative for acute cardiopulmonary disease Electronically Signed   By: Gilmer Mor D.O.   On: 12/03/2018 20:44    Procedures Procedures (including critical care time)  Medications Ordered in ED Medications - No data to display   Initial Impression / Assessment and Plan / ED Course  I have reviewed the triage  vital signs and the nursing notes.  Pertinent labs & imaging results that were available during my care of the patient were reviewed by me and considered in my medical decision making (see chart for details).     Patient with symptoms consistent with influenza. He has had a sick contact. Vitals are stable.  No signs of dehydration, tolerating PO's.  CXR is negative. Patient outside the recommended 24-48 hour window of treatment for Tamiflu.  Patient will be discharged with instructions to orally hydrate, rest, and use over-the-counter medications such as anti-inflammatories ibuprofen and Aleve for muscle aches and Tylenol for fever.  Continue OTC cough medication. Follow up to PCP as needed. Return precautions discussed. Patient understands and agrees with plan. Patient vitals stable throughout ED course and discharged in satisfactory condition.   Final Clinical Impressions(s) / ED Diagnoses   Final diagnoses:  Influenza-like symptoms    ED Discharge Orders    None       Emi HolesLaw, Khiem Gargis M, PA-C 12/04/18 Redgie [id number]    Isaacs, Cameron, MD 12/04/18 1125

## 2018-12-03 NOTE — Discharge Instructions (Signed)
Continue taking the over-the-counter medication you have been taking.  You can alternate with ibuprofen as prescribed over-the-counter.  Make sure to get plenty of rest and drink plenty of fluids.  Please return to the emergency department if you develop any new or worsening symptoms

## 2018-12-03 NOTE — ED Notes (Signed)
Discharge instructions discussed with Pt. Pt verbalized understanding. Pt stable and leaving via power chair.

## 2018-12-03 NOTE — ED Triage Notes (Signed)
Pt reports cough/nasal congestin, fever, chills/body aches since Sunday. Pt states his step dad was dx with the flu.

## 2019-01-07 ENCOUNTER — Encounter: Payer: Self-pay | Admitting: Family Medicine

## 2019-01-07 ENCOUNTER — Ambulatory Visit: Payer: Medicaid Other | Attending: Family Medicine | Admitting: Family Medicine

## 2019-01-07 ENCOUNTER — Other Ambulatory Visit: Payer: Self-pay

## 2019-01-07 VITALS — BP 143/77 | HR 108 | Temp 98.2°F | Resp 16

## 2019-01-07 DIAGNOSIS — J302 Other seasonal allergic rhinitis: Secondary | ICD-10-CM | POA: Insufficient documentation

## 2019-01-07 DIAGNOSIS — Z79899 Other long term (current) drug therapy: Secondary | ICD-10-CM | POA: Insufficient documentation

## 2019-01-07 DIAGNOSIS — Z1159 Encounter for screening for other viral diseases: Secondary | ICD-10-CM | POA: Insufficient documentation

## 2019-01-07 DIAGNOSIS — G129 Spinal muscular atrophy, unspecified: Secondary | ICD-10-CM | POA: Diagnosis not present

## 2019-01-07 DIAGNOSIS — R05 Cough: Secondary | ICD-10-CM | POA: Diagnosis not present

## 2019-01-07 DIAGNOSIS — R059 Cough, unspecified: Secondary | ICD-10-CM

## 2019-01-07 MED ORDER — BENZONATATE 100 MG PO CAPS: 100.0000 mg | ORAL_CAPSULE | Freq: Three times a day (TID) | ORAL | 0 refills | Status: DC | PRN

## 2019-01-07 MED ORDER — LORATADINE 10 MG PO TABS: ORAL_TABLET | ORAL | 3 refills | Status: DC

## 2019-01-07 MED ORDER — ALBUTEROL SULFATE HFA 108 (90 BASE) MCG/ACT IN AERS: 2.0000 | INHALATION_SPRAY | Freq: Four times a day (QID) | RESPIRATORY_TRACT | 6 refills | Status: DC | PRN

## 2019-01-07 NOTE — Progress Notes (Signed)
Subjective:  Patient ID: Wayne Nichols, male    DOB: 1991-08-16  Age: 28 y.o. MRN: [id number]  CC: Cough   HPI Wayne Nichols is a 28 year old male with a history of spinal muscular atrophy, acne vulgaris, seasonal allergies, anxiety and depression who presents to the clinic for a follow-up visit. He was seen at the ED last month for flulike symptoms after a sick contact with his stepfather who had the flu however was not placed on Tamiflu due to being outside of the window of treatment. He presents today complaining of ongoing cough with yellowish mucus which is worse at night.  He previously had fever, chills, sore throat which have resolved.  He is wondering if he needs to be tested for COVID-19 Denies history of recent travel or contact with someone with COVD-19. Denies sinus pressure or pain and has no myalgias.  Of note he is on Claritin for allergies. Seen by neurology at the [facility] clinic at [facility] in 06/2018 with discussion regarding initiation of Spinraza but he is still considering this.  Past Medical History:  Diagnosis Date  . Hemorrhoids   . Seasonal allergies   . Spinal muscular atrophy [facility])     Past Surgical History:  Procedure Laterality Date  . BACK SURGERY    . MUSCLE BIOPSY    . TENDON RELEASE      Family History  Problem Relation Age of Onset  . Stroke Maternal Grandmother   . Heart disease Maternal Grandmother     Allergies  Allergen Reactions  . Amoxicillin Hives  . Penicillins Hives and Rash    Outpatient Medications Prior to Visit  Medication Sig Dispense Refill  . benzoyl peroxide 10 % gel Apply topically.    . clindamycin (CLEOCIN T) 1 % external solution Apply topically.    . cyclobenzaprine (FLEXERIL) 5 MG tablet Take 1 tablet (5 mg total) by mouth at bedtime as needed for muscle spasms. (Patient not taking: Reported on 01/07/2019) 15 tablet 0  . ibuprofen (ADVIL,MOTRIN) 200 MG  tablet Take 200-400 mg by mouth every 6 (six) hours as needed for moderate pain.     Marland Kitchen lidocaine (LIDODERM) 5 % Place 1 patch onto the skin daily. Remove & Discard patch within 12 hours. (Patient not taking: Reported on 01/07/2019) 30 patch 0  . phenylephrine-shark liver oil-mineral oil-petrolatum (PREPARATION H) 0.25-3-14-71.9 % rectal ointment Place 1 application rectally 2 (two) times daily as needed for hemorrhoids. Reported on 05/01/2016    . predniSONE (DELTASONE) 20 MG tablet Two daily with food (Patient not taking: Reported on 01/07/2019) 10 tablet 0  . sulfamethoxazole-trimethoprim (BACTRIM DS,SEPTRA DS) 800-160 MG tablet Take 1 tablet by mouth 2 (two) times daily. (Patient not taking: Reported on 01/07/2019) 20 tablet 0  . tretinoin (RETIN-A) 0.025 % cream Apply topically at bedtime.    Marland Kitchen albuterol (PROVENTIL HFA;VENTOLIN HFA) 108 (90 Base) MCG/ACT inhaler Inhale 2 puffs into the lungs every 6 (six) hours as needed for wheezing. 1 Inhaler 6  . loratadine (CLARITIN) 10 MG tablet TAKE 1 TABLET BY MOUTH DAILY AS NEEDED FOR ALLERGIES 30 tablet 0   No facility-administered medications prior to visit.      ROS Review of Systems  Constitutional: Negative for activity change and appetite change.  HENT: Negative for sinus pressure and sore throat.   Eyes: Negative for visual disturbance.  Respiratory: Negative for cough, chest tightness and shortness of breath.   Cardiovascular: Negative for chest  pain and leg swelling.  Gastrointestinal: Negative for abdominal distention, abdominal pain, constipation and diarrhea.  Endocrine: Negative.   Genitourinary: Negative for dysuria.  Musculoskeletal: Negative for joint swelling and myalgias.  Skin: Negative for rash.  Allergic/Immunologic: Negative.   Neurological: Negative for weakness, light-headedness and numbness.  Psychiatric/Behavioral: Negative for dysphoric mood and suicidal ideas.    Objective:  BP (!) 143/77   Pulse (!) 108   Temp  98.2 F (36.8 C) (Oral)   Resp 16   SpO2 95%   BP/Weight 01/07/2019 12/03/2018 07/12/2018  Systolic BP 143 112 115  Diastolic BP 77 68 77  Wt. (Lbs) - - -  BMI - - -      Physical Exam Constitutional:      Appearance: He is well-developed.  HENT:     Mouth/Throat:     Comments: Limited mobility of jaw Cardiovascular:     Rate and Rhythm: Normal rate.     Heart sounds: Normal heart sounds. No murmur.  Pulmonary:     Effort: Pulmonary effort is normal.     Breath sounds: Normal breath sounds. No wheezing or rales.  Chest:     Chest wall: No tenderness.  Abdominal:     General: Bowel sounds are normal. There is no distension.     Palpations: Abdomen is soft. There is no mass.     Tenderness: There is no abdominal tenderness.  Musculoskeletal:     Comments: Reduced range of motion in lower extremities  Neurological:     Mental Status: He is alert and oriented to person, place, and time.     CMP Latest Ref Rng & Units 06/05/2017 04/17/2017 05/01/2016  Glucose 65 - 99 mg/dL 83 79 76  BUN 6 - 20 mg/dL 12 16 12   Creatinine 0.76 - 1.27 mg/dL 6.21(H) 0.86(V) 7.84(O)  Sodium 134 - 144 mmol/L 143 142 139  Potassium 3.5 - 5.2 mmol/L 4.1 4.3 4.2  Chloride 96 - 106 mmol/L 105 104 107  CO2 20 - 29 mmol/L 26 19(L) 23  Calcium 8.7 - 10.2 mg/dL 9.4 9.2 9.3  Total Protein 6.0 - 8.5 g/dL 7.4 7.5 7.1  Total Bilirubin 0.0 - 1.2 mg/dL 0.2 0.4 0.6  Alkaline Phos 39 - 117 IU/L 46 53 46  AST 0 - 40 IU/L 58(H) 85(H) 24  ALT 0 - 44 IU/L 125(H) 191(H) 40    Lipid Panel     Component Value Date/Time   CHOL 189 04/17/2017 1001   TRIG 66 04/17/2017 1001   HDL 56 04/17/2017 1001   CHOLHDL 3.4 04/17/2017 1001   CHOLHDL 2.8 05/01/2016 1012   VLDL 10 05/01/2016 1012   LDLCALC 120 (H) 04/17/2017 1001    CBC    Component Value Date/Time   WBC 4.2 04/17/2017 1001   WBC 3.2 (L) 08/22/2015 0937   RBC 5.05 04/17/2017 1001   RBC 5.35 08/22/2015 0937   HGB 13.7 04/17/2017 1001   HCT 42.8  04/17/2017 1001   PLT 224 04/17/2017 1001   MCV 85 04/17/2017 1001   MCH 27.1 04/17/2017 1001   MCH 26.9 08/22/2015 0937   MCHC 32.0 04/17/2017 1001   MCHC 32.0 08/22/2015 0937   RDW 14.2 04/17/2017 1001   LYMPHSABS 1.6 04/17/2017 1001   MONOABS 0.2 08/22/2015 0937   EOSABS 0.1 04/17/2017 1001   BASOSABS 0.0 04/17/2017 1001    Lab Results  Component Value Date   HGBA1C 5.3 08/22/2015    Assessment & Plan:   1. Cough  Likely sinus related Placed on Tessalon Perles Continue antihistamine Discussed with him that he is a low risk for COVID-19 at this time but if symptoms persist he knows to communicate via MyChart. - CBC with Differential/Platelet - Basic Metabolic Panel  2. Seasonal allergies Refilled Claritin  3. Screening for viral disease - HIV Antibody (routine testing w rflx)   Meds ordered this encounter  Medications  . loratadine (CLARITIN) 10 MG tablet    Sig: TAKE 1 TABLET BY MOUTH DAILY AS NEEDED FOR ALLERGIES    Dispense:  30 tablet    Refill:  3  . benzonatate (TESSALON) 100 MG capsule    Sig: Take 1 capsule (100 mg total) by mouth 3 (three) times daily as needed for cough.    Dispense:  30 capsule    Refill:  0  . albuterol (PROVENTIL HFA;VENTOLIN HFA) 108 (90 Base) MCG/ACT inhaler    Sig: Inhale 2 puffs into the lungs every 6 (six) hours as needed for wheezing.    Dispense:  1 Inhaler    Refill:  6    Follow-up: Return in about 1 year (around 01/07/2020) for Follow-up of chronic medical conditions.       Hoy Register, MD, FAAFP. [facility] and Wellness [place], [place] [phone]   01/07/2019, 2:03 PM

## 2019-01-08 LAB — CBC WITH DIFFERENTIAL/PLATELET
BASOS ABS: 0 10*3/uL (ref 0.0–0.2)
Basos: 0 %
EOS (ABSOLUTE): 0 10*3/uL (ref 0.0–0.4)
Eos: 1 %
Hematocrit: 42.6 % (ref 37.5–51.0)
Hemoglobin: 13.5 g/dL (ref 13.0–17.7)
IMMATURE GRANULOCYTES: 0 %
Immature Grans (Abs): 0 10*3/uL (ref 0.0–0.1)
LYMPHS: 24 %
Lymphocytes Absolute: 1.9 10*3/uL (ref 0.7–3.1)
MCH: 26.4 pg — AB (ref 26.6–33.0)
MCHC: 31.7 g/dL (ref 31.5–35.7)
MCV: 83 fL (ref 79–97)
Monocytes Absolute: 0.9 10*3/uL (ref 0.1–0.9)
Monocytes: 11 %
NEUTROS PCT: 64 %
Neutrophils Absolute: 5.2 10*3/uL (ref 1.4–7.0)
Platelets: 251 10*3/uL (ref 150–450)
RBC: 5.12 x10E6/uL (ref 4.14–5.80)
RDW: 13 % (ref 11.6–15.4)
WBC: 8.1 10*3/uL (ref 3.4–10.8)

## 2019-01-08 LAB — BASIC METABOLIC PANEL
BUN / CREAT RATIO: 31 — AB (ref 9–20)
BUN: 8 mg/dL (ref 6–20)
CO2: 25 mmol/L (ref 20–29)
CREATININE: 0.26 mg/dL — AB (ref 0.76–1.27)
Calcium: 10 mg/dL (ref 8.7–10.2)
Chloride: 102 mmol/L (ref 96–106)
GFR calc Af Amer: 223 mL/min/{1.73_m2} (ref >59)
GFR calc non Af Amer: 193 mL/min/{1.73_m2} (ref >59)
GLUCOSE: 81 mg/dL (ref 65–99)
Potassium: 3.9 mmol/L (ref 3.5–5.2)
SODIUM: 144 mmol/L (ref 134–144)

## 2019-01-08 LAB — HIV ANTIBODY (ROUTINE TESTING W REFLEX): HIV SCREEN 4TH GENERATION: NONREACTIVE

## 2019-01-19 DIAGNOSIS — G129 Spinal muscular atrophy, unspecified: Secondary | ICD-10-CM | POA: Diagnosis not present

## 2019-03-03 DIAGNOSIS — G121 Other inherited spinal muscular atrophy: Secondary | ICD-10-CM | POA: Diagnosis not present

## 2019-03-03 DIAGNOSIS — G129 Spinal muscular atrophy, unspecified: Secondary | ICD-10-CM | POA: Diagnosis not present

## 2019-04-27 DIAGNOSIS — G129 Spinal muscular atrophy, unspecified: Secondary | ICD-10-CM | POA: Diagnosis not present

## 2019-09-04 DIAGNOSIS — L700 Acne vulgaris: Secondary | ICD-10-CM | POA: Diagnosis not present

## 2019-10-01 ENCOUNTER — Ambulatory Visit: Payer: Medicaid Other

## 2019-11-22 ENCOUNTER — Other Ambulatory Visit: Payer: Self-pay | Admitting: Family Medicine

## 2019-12-01 DIAGNOSIS — G129 Spinal muscular atrophy, unspecified: Secondary | ICD-10-CM | POA: Diagnosis not present

## 2019-12-01 DIAGNOSIS — Z993 Dependence on wheelchair: Secondary | ICD-10-CM | POA: Diagnosis not present

## 2019-12-01 DIAGNOSIS — Z79899 Other long term (current) drug therapy: Secondary | ICD-10-CM | POA: Diagnosis not present

## 2019-12-01 DIAGNOSIS — Z981 Arthrodesis status: Secondary | ICD-10-CM | POA: Diagnosis not present

## 2020-01-12 ENCOUNTER — Other Ambulatory Visit: Payer: Self-pay | Admitting: Family Medicine

## 2020-01-18 DIAGNOSIS — G129 Spinal muscular atrophy, unspecified: Secondary | ICD-10-CM | POA: Diagnosis not present

## 2020-02-08 ENCOUNTER — Encounter: Payer: Self-pay | Admitting: Family Medicine

## 2020-02-08 ENCOUNTER — Ambulatory Visit: Payer: Medicaid Other | Attending: Family Medicine | Admitting: Family Medicine

## 2020-02-08 ENCOUNTER — Other Ambulatory Visit: Payer: Self-pay

## 2020-02-08 DIAGNOSIS — L700 Acne vulgaris: Secondary | ICD-10-CM

## 2020-02-08 DIAGNOSIS — J302 Other seasonal allergic rhinitis: Secondary | ICD-10-CM

## 2020-02-08 DIAGNOSIS — G129 Spinal muscular atrophy, unspecified: Secondary | ICD-10-CM | POA: Diagnosis not present

## 2020-02-08 MED ORDER — ALBUTEROL SULFATE HFA 108 (90 BASE) MCG/ACT IN AERS: 2.0000 | INHALATION_SPRAY | Freq: Four times a day (QID) | RESPIRATORY_TRACT | 3 refills | Status: DC | PRN

## 2020-02-08 MED ORDER — LORATADINE 10 MG PO TABS: ORAL_TABLET | ORAL | 6 refills | Status: DC

## 2020-02-08 NOTE — Progress Notes (Signed)
Virtual Visit via Telephone Note  I connected with Wayne Nichols, on 02/08/2020 at 3:56 PM by telephone due to the COVID-19 pandemic and verified that I am speaking with the correct person using two identifiers.   Consent: I discussed the limitations, risks, security and privacy concerns of performing an evaluation and management service by telephone and the availability of in person appointments. I also discussed with the patient that there may be a patient responsible charge related to this service. The patient expressed understanding and agreed to proceed.   Location of Patient: Home  Location of Provider: Clinic   Persons participating in Telemedicine visit: Wayne Nichols Dr. Margarita Rana     History of Present Illness: Wayne Nichols is a 29 year old male with a history of spinal muscular atrophy, acne vulgaris, seasonal allergies, anxiety and depression who presents to the clinic for a follow-up visit. He is working from home and is currently undergoing training in his knee joint.  He would like a referral to Dermatology. Currently seeing [facility] and has not had a great experience. He needs a rx for Albuterol MDI once in a while when he looses his breath. For his allergies, he uses Claritin all year round. Paxil is prescribed by his Neurologist at [facility] and his last was 2 months ago.  Past Medical History:  Diagnosis Date  . Hemorrhoids   . Seasonal allergies   . Spinal muscular atrophy (HCC)    Allergies  Allergen Reactions  . Amoxicillin Hives  . Penicillins Hives and Rash    Current Outpatient Medications on File Prior to Visit  Medication Sig Dispense Refill  . albuterol (PROVENTIL HFA;VENTOLIN HFA) 108 (90 Base) MCG/ACT inhaler Inhale 2 puffs into the lungs every 6 (six) hours as needed for wheezing. 1 Inhaler 6  . loratadine (CLARITIN) 10 MG tablet TAKE 1 TABLET BY MOUTH DAILY AS NEEDED FOR ALLERGIES 30 tablet 3  .  PARoxetine (PAXIL) 10 MG tablet Take by mouth.    . Risdiplam (EVRYSDI) 0.75 MG/ML SOLR Take by mouth.    . benzonatate (TESSALON) 100 MG capsule Take 1 capsule (100 mg total) by mouth 3 (three) times daily as needed for cough. (Patient not taking: Reported on 02/08/2020) 30 capsule 0  . benzoyl peroxide 10 % gel Apply topically.    . clindamycin (CLEOCIN T) 1 % external solution Apply topically.    . cyclobenzaprine (FLEXERIL) 5 MG tablet Take 1 tablet (5 mg total) by mouth at bedtime as needed for muscle spasms. (Patient not taking: Reported on 01/07/2019) 15 tablet 0  . ibuprofen (ADVIL,MOTRIN) 200 MG tablet Take 200-400 mg by mouth every 6 (six) hours as needed for moderate pain.     Marland Kitchen lidocaine (LIDODERM) 5 % Place 1 patch onto the skin daily. Remove & Discard patch within 12 hours. (Patient not taking: Reported on 01/07/2019) 30 patch 0  . phenylephrine-shark liver oil-mineral oil-petrolatum (PREPARATION H) 0.25-3-14-71.9 % rectal ointment Place 1 application rectally 2 (two) times daily as needed for hemorrhoids. Reported on 05/01/2016    . predniSONE (DELTASONE) 20 MG tablet Two daily with food (Patient not taking: Reported on 01/07/2019) 10 tablet 0  . sulfamethoxazole-trimethoprim (BACTRIM DS,SEPTRA DS) 800-160 MG tablet Take 1 tablet by mouth 2 (two) times daily. (Patient not taking: Reported on 01/07/2019) 20 tablet 0  . tretinoin (RETIN-A) 0.025 % cream Apply topically at bedtime.     No current facility-administered medications on file prior to visit.    Observations/Objective: Awake,  alert, oriented x3 Not in acute distress  Assessment and Plan: 1. Seasonal allergies Stable - loratadine (CLARITIN) 10 MG tablet; TAKE 1 TABLET BY MOUTH DAILY AS NEEDED FOR ALLERGIES  Dispense: 30 tablet; Refill: 6 - albuterol (VENTOLIN HFA) 108 (90 Base) MCG/ACT inhaler; Inhale 2 puffs into the lungs every 6 (six) hours as needed for wheezing.  Dispense: 18 g; Refill: 3  2. Acne  vulgaris Uncontrolled Currently on Retin-A Refer to dermatology - Ambulatory referral to Dermatology  3. Spinal muscular atrophy (HCC) Stable Managed by neurology Currently on baclofen   Follow Up Instructions:   Return in about 1 year (around 02/07/2021), or if symptoms worsen or fail to improve, for Chronic disease management.  I discussed the assessment and treatment plan with the patient. The patient was provided an opportunity to ask questions and all were answered. The patient agreed with the plan and demonstrated an understanding of the instructions.   The patient was advised to call back or seek an in-person evaluation if the symptoms worsen or if the condition fails to improve as anticipated.     I provided 12 minutes total of non-face-to-face time during this encounter including median intraservice time, reviewing previous notes, investigations, ordering medications, medical decision making, coordinating care and patient verbalized understanding at the end of the visit.     Hoy Register, MD, FAAFP. [facility] and Wellness [place], [place] [phone]   02/08/2020, 3:56 PM

## 2020-02-15 DIAGNOSIS — H5213 Myopia, bilateral: Secondary | ICD-10-CM | POA: Diagnosis not present

## 2020-02-22 DIAGNOSIS — G8250 Quadriplegia, unspecified: Secondary | ICD-10-CM | POA: Diagnosis not present

## 2020-02-22 DIAGNOSIS — G129 Spinal muscular atrophy, unspecified: Secondary | ICD-10-CM | POA: Diagnosis not present

## 2020-02-22 DIAGNOSIS — Z993 Dependence on wheelchair: Secondary | ICD-10-CM | POA: Diagnosis not present

## 2020-03-08 DIAGNOSIS — H5213 Myopia, bilateral: Secondary | ICD-10-CM | POA: Diagnosis not present

## 2020-03-25 DIAGNOSIS — H5213 Myopia, bilateral: Secondary | ICD-10-CM | POA: Diagnosis not present

## 2020-04-21 DIAGNOSIS — Z9911 Dependence on respirator [ventilator] status: Secondary | ICD-10-CM

## 2020-04-21 HISTORY — DX: Dependence on respirator (ventilator) status: Z99.11

## 2020-05-24 DIAGNOSIS — G129 Spinal muscular atrophy, unspecified: Secondary | ICD-10-CM | POA: Diagnosis not present

## 2020-06-22 DIAGNOSIS — G129 Spinal muscular atrophy, unspecified: Secondary | ICD-10-CM | POA: Diagnosis not present

## 2020-07-22 DIAGNOSIS — G129 Spinal muscular atrophy, unspecified: Secondary | ICD-10-CM | POA: Diagnosis not present

## 2020-08-22 DIAGNOSIS — G129 Spinal muscular atrophy, unspecified: Secondary | ICD-10-CM | POA: Diagnosis not present

## 2020-08-30 ENCOUNTER — Other Ambulatory Visit: Payer: Self-pay | Admitting: Family Medicine

## 2020-08-30 DIAGNOSIS — J302 Other seasonal allergic rhinitis: Secondary | ICD-10-CM

## 2020-08-30 NOTE — Telephone Encounter (Signed)
Requested Prescriptions  Pending Prescriptions Disp Refills   loratadine (CLARITIN) 10 MG tablet [Pharmacy Med Name: LORATADINE 10 MG TABLET] 90 tablet 1    Sig: TAKE 1 TABLET BY MOUTH DAILY AS NEEDED FOR ALLERGIES     Ear, Nose, and Throat:  Antihistamines Passed - 08/30/2020 12:18 AM      Passed - Valid encounter within last 12 months    Recent Outpatient Visits          6 months ago Seasonal allergies   [facility] Hoy Register, MD   1 year ago Cough   [facility] Hoy Register, MD   3 years ago Spinal muscular atrophy [facility])   [place] [facility] Hoy Register, MD   3 years ago Screening for metabolic disorder   [facility] Hoy Register, MD   4 years ago Muscular atrophy, spinal [facility])   [place] Hoy Register, MD

## 2020-09-23 DIAGNOSIS — G129 Spinal muscular atrophy, unspecified: Secondary | ICD-10-CM | POA: Diagnosis not present

## 2020-10-24 DIAGNOSIS — G129 Spinal muscular atrophy, unspecified: Secondary | ICD-10-CM | POA: Diagnosis not present

## 2020-10-25 ENCOUNTER — Emergency Department (HOSPITAL_COMMUNITY)
Admission: EM | Admit: 2020-10-25 | Discharge: 2020-10-26 | Disposition: A | Discharge status: Home / Self Care | Payer: 59 | Attending: Emergency Medicine | Admitting: Emergency Medicine

## 2020-10-25 ENCOUNTER — Other Ambulatory Visit: Payer: Self-pay

## 2020-10-25 DIAGNOSIS — Z5321 Procedure and treatment not carried out due to patient leaving prior to being seen by health care provider: Secondary | ICD-10-CM | POA: Diagnosis not present

## 2020-10-25 DIAGNOSIS — Z20822 Contact with and (suspected) exposure to covid-19: Secondary | ICD-10-CM | POA: Insufficient documentation

## 2020-10-25 DIAGNOSIS — R519 Headache, unspecified: Secondary | ICD-10-CM | POA: Insufficient documentation

## 2020-10-25 DIAGNOSIS — M7918 Myalgia, other site: Secondary | ICD-10-CM | POA: Diagnosis present

## 2020-10-26 LAB — POC SARS CORONAVIRUS 2 AG -  ED: SARS Coronavirus 2 Ag: NEGATIVE

## 2020-10-26 NOTE — ED Triage Notes (Signed)
Pt states he wants covid testing for headache, body aches and not feeling well for the past 24 hours.

## 2020-10-26 NOTE — ED Notes (Signed)
Pt called 3x no answer  

## 2020-10-28 ENCOUNTER — Telehealth: Payer: Self-pay | Admitting: Family Medicine

## 2020-10-28 DIAGNOSIS — J302 Other seasonal allergic rhinitis: Secondary | ICD-10-CM

## 2020-10-28 MED ORDER — PREDNISONE 20 MG PO TABS: 20.0000 mg | ORAL_TABLET | Freq: Every day | ORAL | 0 refills | Status: DC

## 2020-10-28 MED ORDER — ALBUTEROL SULFATE HFA 108 (90 BASE) MCG/ACT IN AERS: 2.0000 | INHALATION_SPRAY | Freq: Four times a day (QID) | RESPIRATORY_TRACT | 3 refills | Status: AC | PRN

## 2020-10-28 MED ORDER — BENZONATATE 100 MG PO CAPS: 100.0000 mg | ORAL_CAPSULE | Freq: Three times a day (TID) | ORAL | 0 refills | Status: DC | PRN

## 2020-10-28 NOTE — Telephone Encounter (Signed)
Copied from CRM [id number]. Topic: General - Other >> Oct 26, 2020  1:06 PM Jaquita Rector A wrote: Reason for CRM: Patient called in to inquire of Dr Alvis Lemmings what he can get from the Pharmacy for his symptoms sore throat, head congestion and other symptoms. Dad tested positive for Covid but patient test done at the ER on 10/25/20 came back negative. Please advise Ph# [phone]

## 2020-10-28 NOTE — Telephone Encounter (Signed)
Will route to PCP for review. 

## 2020-10-28 NOTE — Telephone Encounter (Signed)
I have sent prescriptions for Tessalon perles, Prednisone and an inhaler to his Pharmacy

## 2020-10-31 NOTE — Telephone Encounter (Signed)
Pt was called and a VM was left informing pt that medications was sent to his pharmacy.

## 2020-11-24 DIAGNOSIS — G129 Spinal muscular atrophy, unspecified: Secondary | ICD-10-CM | POA: Diagnosis not present

## 2020-12-05 DIAGNOSIS — L700 Acne vulgaris: Secondary | ICD-10-CM | POA: Diagnosis not present

## 2020-12-05 DIAGNOSIS — Z5181 Encounter for therapeutic drug level monitoring: Secondary | ICD-10-CM | POA: Diagnosis not present

## 2020-12-05 DIAGNOSIS — L662 Folliculitis decalvans: Secondary | ICD-10-CM | POA: Diagnosis not present

## 2020-12-22 DIAGNOSIS — G129 Spinal muscular atrophy, unspecified: Secondary | ICD-10-CM | POA: Diagnosis not present

## 2021-01-09 DIAGNOSIS — L700 Acne vulgaris: Secondary | ICD-10-CM | POA: Diagnosis not present

## 2021-01-09 DIAGNOSIS — L739 Follicular disorder, unspecified: Secondary | ICD-10-CM | POA: Diagnosis not present

## 2021-01-09 DIAGNOSIS — Z5181 Encounter for therapeutic drug level monitoring: Secondary | ICD-10-CM | POA: Diagnosis not present

## 2021-01-22 DIAGNOSIS — G129 Spinal muscular atrophy, unspecified: Secondary | ICD-10-CM | POA: Diagnosis not present

## 2021-02-21 DIAGNOSIS — G129 Spinal muscular atrophy, unspecified: Secondary | ICD-10-CM | POA: Diagnosis not present

## 2021-03-24 DIAGNOSIS — G129 Spinal muscular atrophy, unspecified: Secondary | ICD-10-CM | POA: Diagnosis not present

## 2021-04-13 DIAGNOSIS — Z5181 Encounter for therapeutic drug level monitoring: Secondary | ICD-10-CM | POA: Diagnosis not present

## 2021-04-13 DIAGNOSIS — L700 Acne vulgaris: Secondary | ICD-10-CM | POA: Diagnosis not present

## 2021-04-13 DIAGNOSIS — Z79899 Other long term (current) drug therapy: Secondary | ICD-10-CM | POA: Diagnosis not present

## 2021-04-23 DIAGNOSIS — G129 Spinal muscular atrophy, unspecified: Secondary | ICD-10-CM | POA: Diagnosis not present

## 2021-05-02 ENCOUNTER — Other Ambulatory Visit: Payer: Self-pay | Admitting: Family Medicine

## 2021-05-02 DIAGNOSIS — G129 Spinal muscular atrophy, unspecified: Secondary | ICD-10-CM | POA: Diagnosis not present

## 2021-05-02 DIAGNOSIS — J302 Other seasonal allergic rhinitis: Secondary | ICD-10-CM

## 2021-05-02 NOTE — Telephone Encounter (Signed)
Requested medication (s) are due for refill today: yes  Requested medication (s) are on the active medication list:yes  Last refill:  12/26/2020  Future visit scheduled: no  Notes to clinic:  due for follow up appointment Message has been sent to patient    Requested Prescriptions  Pending Prescriptions Disp Refills   loratadine (CLARITIN) 10 MG tablet [Pharmacy Med Name: LORATADINE 10 MG TABLET] 90 tablet 1    Sig: TAKE 1 TABLET BY MOUTH DAILY AS NEEDED FOR ALLERGIES      Ear, Nose, and Throat:  Antihistamines Failed - 05/02/2021  9:17 AM      Failed - Valid encounter within last 12 months    Recent Outpatient Visits           1 year ago Seasonal allergies   [facility] Hoy Register, MD   2 years ago Cough   [place] [facility] Hoy Register, MD   3 years ago Spinal muscular atrophy [facility])   [place] [facility] Hoy Register, MD   4 years ago Screening for metabolic disorder   [place] [facility] Hoy Register, MD   5 years ago Muscular atrophy, spinal [facility])   [place] [facility] Hoy Register, MD

## 2021-05-15 DIAGNOSIS — L700 Acne vulgaris: Secondary | ICD-10-CM | POA: Diagnosis not present

## 2021-05-15 DIAGNOSIS — Z5181 Encounter for therapeutic drug level monitoring: Secondary | ICD-10-CM | POA: Diagnosis not present

## 2021-05-18 ENCOUNTER — Encounter: Payer: Self-pay | Admitting: Family Medicine

## 2021-05-18 ENCOUNTER — Ambulatory Visit: Payer: 59 | Attending: Family Medicine | Admitting: Family Medicine

## 2021-05-18 ENCOUNTER — Other Ambulatory Visit: Payer: Self-pay

## 2021-05-18 DIAGNOSIS — G129 Spinal muscular atrophy, unspecified: Secondary | ICD-10-CM

## 2021-05-18 DIAGNOSIS — L700 Acne vulgaris: Secondary | ICD-10-CM | POA: Diagnosis not present

## 2021-05-18 DIAGNOSIS — J302 Other seasonal allergic rhinitis: Secondary | ICD-10-CM

## 2021-05-18 MED ORDER — LORATADINE 10 MG PO TABS: 10.0000 mg | ORAL_TABLET | Freq: Every day | ORAL | 1 refills | Status: DC

## 2021-05-18 NOTE — Progress Notes (Signed)
Virtual Visit via Video Note  I connected with Wayne Nichols, on 05/18/2021 at 9:46 AM by video enabled telemedicine device due to the COVID-19 pandemic and verified that I am speaking with the correct person using two identifiers.   Consent: I discussed the limitations, risks, security and privacy concerns of performing an evaluation and management service by telemedicine and the availability of in person appointments. I also discussed with the patient that there may be a patient responsible charge related to this service. The patient expressed understanding and agreed to proceed.   Location of Patient: Home  Location of Provider: Clinic   Persons participating in Telemedicine visit: Wayne Nichols Dr. Alvis Lemmings     History of Present Illness: Wayne Nichols is a 30 year old male with a history of spinal muscular atrophy, acne vulgaris, seasonal allergies, anxiety and depression.  He is requesting refill of his loratadine for his allergies.  He also uses albuterol MDI as needed but does not require refill of his albuterol at this time. Followed by [facility]. His acne is managed by General Dermatology, [facility] and he is currently on Accutane.  He has no additional concerns today Past Medical History:  Diagnosis Date   Hemorrhoids    Seasonal allergies    Spinal muscular atrophy (HCC)    Allergies  Allergen Reactions   Amoxicillin Hives   Penicillins Hives and Rash    Current Outpatient Medications on File Prior to Visit  Medication Sig Dispense Refill   albuterol (VENTOLIN HFA) 108 (90 Base) MCG/ACT inhaler Inhale 2 puffs into the lungs every 6 (six) hours as needed for wheezing. 18 g 3   benzonatate (TESSALON) 100 MG capsule Take 1 capsule (100 mg total) by mouth 3 (three) times daily as needed for cough. 30 capsule 0   benzoyl peroxide 10 % gel Apply topically.     clindamycin (CLEOCIN T) 1 % external solution  Apply topically.     cyclobenzaprine (FLEXERIL) 5 MG tablet Take 1 tablet (5 mg total) by mouth at bedtime as needed for muscle spasms. (Patient not taking: Reported on 01/07/2019) 15 tablet 0   ibuprofen (ADVIL,MOTRIN) 200 MG tablet Take 200-400 mg by mouth every 6 (six) hours as needed for moderate pain.      lidocaine (LIDODERM) 5 % Place 1 patch onto the skin daily. Remove & Discard patch within 12 hours. (Patient not taking: Reported on 01/07/2019) 30 patch 0   PARoxetine (PAXIL) 10 MG tablet Take by mouth.     phenylephrine-shark liver oil-mineral oil-petrolatum (PREPARATION H) 0.25-3-14-71.9 % rectal ointment Place 1 application rectally 2 (two) times daily as needed for hemorrhoids. Reported on 05/01/2016     Risdiplam (EVRYSDI) 0.75 MG/ML SOLR Take by mouth.     sulfamethoxazole-trimethoprim (BACTRIM DS,SEPTRA DS) 800-160 MG tablet Take 1 tablet by mouth 2 (two) times daily. (Patient not taking: Reported on 01/07/2019) 20 tablet 0   tretinoin (RETIN-A) 0.025 % cream Apply topically at bedtime.     No current facility-administered medications on file prior to visit.    ROS: See HPI  Observations/Objective: Gen - Awake, alert, oriented x3 Resp - Not in acute distress Skin - normal MSS - Limited ROM of lower extremities Psych - normal mood  Assessment and Plan: 1. Seasonal allergies Controlled - loratadine (CLARITIN) 10 MG tablet; Take 1 tablet (10 mg total) by mouth daily.  Dispense: 90 tablet; Refill: 1  2. Spinal muscular atrophy (HCC) Stable Followed  by neurology clinic  3. Acne vulgaris Controlled Currently on Accutane   Follow Up Instructions: 6 months   I discussed the assessment and treatment plan with the patient. The patient was provided an opportunity to ask questions and all were answered. The patient agreed with the plan and demonstrated an understanding of the instructions.   The patient was advised to call back or seek an in-person evaluation if the symptoms  worsen or if the condition fails to improve as anticipated.     I provided 13 minutes total of Telehealth time during this encounter including median intraservice time, reviewing previous notes, investigations, ordering medications, medical decision making, coordinating care and patient verbalized understanding at the end of the visit.     Hoy Register, MD, FAAFP. [facility] and Wellness [place], [place] [phone]   05/18/2021, 9:46 AM

## 2021-05-24 DIAGNOSIS — G129 Spinal muscular atrophy, unspecified: Secondary | ICD-10-CM | POA: Diagnosis not present

## 2021-06-09 ENCOUNTER — Encounter: Payer: Self-pay | Admitting: Family Medicine

## 2021-06-09 ENCOUNTER — Other Ambulatory Visit: Payer: Self-pay | Admitting: Family Medicine

## 2021-06-09 DIAGNOSIS — K644 Residual hemorrhoidal skin tags: Secondary | ICD-10-CM

## 2021-07-06 DIAGNOSIS — G129 Spinal muscular atrophy, unspecified: Secondary | ICD-10-CM | POA: Diagnosis not present

## 2021-08-05 DIAGNOSIS — G129 Spinal muscular atrophy, unspecified: Secondary | ICD-10-CM | POA: Diagnosis not present

## 2021-09-05 DIAGNOSIS — G129 Spinal muscular atrophy, unspecified: Secondary | ICD-10-CM | POA: Diagnosis not present

## 2021-10-05 DIAGNOSIS — G129 Spinal muscular atrophy, unspecified: Secondary | ICD-10-CM | POA: Diagnosis not present

## 2021-11-05 DIAGNOSIS — G129 Spinal muscular atrophy, unspecified: Secondary | ICD-10-CM | POA: Diagnosis not present

## 2021-11-07 ENCOUNTER — Encounter: Payer: Self-pay | Admitting: Family Medicine

## 2021-11-15 DIAGNOSIS — L219 Seborrheic dermatitis, unspecified: Secondary | ICD-10-CM | POA: Diagnosis not present

## 2021-11-15 DIAGNOSIS — L732 Hidradenitis suppurativa: Secondary | ICD-10-CM | POA: Diagnosis not present

## 2021-11-15 NOTE — Progress Notes (Signed)
Patient chart reviewed with Dr Hyacinth Meeker. Patient has hx of Spinal Muscular Atrophy and is w/c bound. He has issues opening his mouth wide and taking deep breaths. Last neuro note reviewed also with Dr Hyacinth Meeker. He states pt will be better served being done at Main OR. Noelle at Dr Ermalene Searing office notified.

## 2021-11-21 NOTE — H&P (Signed)
°  MRN: [id number] DOB: 1991/08/05  Chief Complaint: New Consultation (External hemorrhoids)  History of Present Illness: Wayne Nichols is a 31 y.o. male who is seen today as an office consultation at the request of Dr. Venetia Night for evaluation of New Consultation (External hemorrhoids) .  [name] has spinal muscular atrophy. He has had rods placed in his back and apparently has had constipation issues since then and has developed some external hemorrhoids. These can be a problem with hygiene and he wanted them removed.  Review of Systems: See HPI as well for other ROS.  ROS   Medical History: Past Medical History:  Diagnosis Date   Anxiety   Patient Active Problem List  Diagnosis   Acne   Anxiety and depression   Spinal muscular atrophy, unspecified (CMS-HCC)   Seasonal allergies   Past Surgical History:  Procedure Laterality Date   EXPLORATION OF SPINAL FUSION   Hamstring release    Allergies  Allergen Reactions   Penicillins Rash and Hives   Current Outpatient Medications on File Prior to Visit  Medication Sig Dispense Refill   risdiplam (EVRYSDI) 0.75 mg/mL SolR Take by mouth   No current facility-administered medications on file prior to visit.   History reviewed. No pertinent family history.   Social History   Tobacco Use  Smoking Status Never  Smokeless Tobacco Never    Social History   Socioeconomic History   Marital status: Single  Tobacco Use   Smoking status: Never   Smokeless tobacco: Never  Vaping Use   Vaping Use: Never used  Substance and Sexual Activity   Alcohol use: Yes  Comment: 3-4 times a year   Drug use: Yes  Comment: Delta 8 CBD   Objective:   Vitals:  09/28/21 0950  BP: 120/70  Pulse: 84  SpO2: 95%  Weight: 56.7 kg (125 lb)   There is no height or weight on file to calculate BMI.  Physical Exam General: Pleasant African-American gentleman in no acute distress who arrives in a motorized wheelchair secondary to his  disability. He has a friend assisting him onto the table where I examined him in the left lateral decubitus position. HEENT : Unremarkable except for glasses Chest: Clear to auscultation Heart: Heart sinus rhythm Breast: Not examined Abdomen: Not examined GU not examined Rectal anus with 2 areas of skin tag consistent with external hemorrhoids. Extremities patient was motor and sensory function were not examined. Neuro motor and sensory function were not examined  Labs, Imaging and Diagnostic Testing: No labs to review  Assessment and Plan:    External hemorrhoids  Spinal muscular atrophy (CMS-HCC)    Plan is to do exam under anesthesia and external hemorrhoidectomy at [facility] day surgery. I explained the procedure to him with resultant pain. Would likely put in a Exparel block at the end of the case.  Pt seen in holding and procedure reviewed and reexam performed.     Jace Fermin Charna Busman, MD

## 2021-11-22 ENCOUNTER — Other Ambulatory Visit: Payer: Self-pay

## 2021-11-22 ENCOUNTER — Encounter (HOSPITAL_COMMUNITY): Payer: Self-pay | Admitting: Surgery

## 2021-11-22 NOTE — Progress Notes (Addendum)
DUE TO COVID-19 ONLY ONE VISITOR IS ALLOWED TO COME WITH YOU AND STAY IN THE WAITING ROOM ONLY DURING PRE OP AND PROCEDURE DAY OF SURGERY.   PCP - Dr Charlott Rakes Cardiologist - n/a Neurology - Dr Jeneen Rinks Ca[place]  Chest x-ray - 12/03/18 (2V) EKG - n/a Stress Test - n/a ECHO - n/a Cardiac Cath - n/a  ICD Pacemaker/Loop - n/a  Sleep Study -  n/a CPAP - none  Anesthesia - Yes  ERAS: Clear liquids til 11:30 AM DOS.  STOP now taking any Aspirin (unless otherwise instructed by your surgeon), Aleve, Naproxen, Ibuprofen, Motrin, Advil, Goody's, BC's, all herbal medications, fish oil, and all vitamins.   Coronavirus Screening Covid test n/a Ambulatory Surgery  Do you have any of the following symptoms:  Cough yes/no: No Fever (>100.58F)  yes/no: No Runny nose yes/no: No Sore throat yes/no: No Difficulty breathing/shortness of breath  yes/no: No  Have you traveled in the last 14 days and where? yes/no: No  Patient verbalized understanding of instructions that were given via phone.

## 2021-11-23 ENCOUNTER — Encounter (HOSPITAL_COMMUNITY): Payer: Self-pay | Admitting: Surgery

## 2021-11-23 NOTE — Progress Notes (Signed)
Anesthesia Chart Review: Wayne Nichols   Case: [id number] Date/Time: 11/24/21 1415   Procedure: EXTERNAL HEMORRHOIDECTOMY   Anesthesia type: General   Pre-op diagnosis: EXTERNAL HEMORRHOIDS AND SPINAL MUSCULAR ATROPHY   Location: [facility] ROOM 08 / [facility]   Surgeons: Johnathan Hausen, MD       DISCUSSION: Patient is a 31 year old male scheduled for the above procedure. Case previously reviewed by anesthesiologist Dr. Sabra Heck and was moved from the [facility] to the Main OR due to history of spinal muscular atrophy with limitation in mouth opening and with taking deep breaths. He also uses non-invasive ventilation (NIV) at night since ~ July 2021 due to overnight desaturations.    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   Neurologist is Dr. Vallarie Mare with [facility] in [place]. Last visit 05/02/21. He was doing overall well. ED visit 10/2020 for URI symptoms, but no major illnesses or hospitalizations. He was tolerating Risdilplam without side effects." Uses motorized wheelchair. No dysphagia or choking, but added, "He reports today that he has had issues with jaw opening since having braces. At one point he could only open his mouth 8 mm, which has improved to 10 mm with PT. He continues with PT under the guidance of an oral surgeon and will continue to work towards improving his ROM with conservative Methods. This has not appeared to lead to weight loss or serious issues with feeding." No facial weakness, had full eye movement, and tongue was midline. He had diffuse atrophy throughout extremities and had lost about 40 degrees of knee extension bilaterally due to contractures." He denied difficulty  breathing. One year follow-up planned. A non-invasive ventilation machine (Trilogy) was ordered in July 2021 after a sleep study "showed overnight desaturations."     Neurology note gives the following information regarding non-invasive ventilation (NIV): "Volume ventilation through non-invasive ventilation (NIV) is intended to stabilize respiratory decline by providing appropriate and timely ventilatory support through its rapid recognition of patient respiratory decline, (which is frequently rapid and unpredictable in patients with progressive neuromuscular disease) specifically with the AVAPS rate setting. This is a ventilator setting that causes the device to be more reactive to frequent changes in respiratory muscle effort common in patients with progressive neuromuscular diseases. NIV ventilators also have a broader range of pressure availability to compensate for this unpredictable decline in neuromuscular function. The higher pressures will allow more time to stabilize the patients condition therefore preventing unnecessary hospitalization due to pneumonia and acute respiratory failure. Due to the degenerative nature of this patients condition such events can be life threating. These functions provide modalities of support unavailable in BiPAP units; therefore BiPAP is not acceptable for this patient. It is important that this patient use this therapy at night and as needed during the day to avoid life threatening situations. Lifetime need."  - History reviewed with anesthesiologist Oleta Mouse, MD. I confirmed the the Trilogy NIV machines are not available at [facility], so I advised that Wayne Nichols bring his equipment to [facility], if portable, otherwise would need to consider RT consult for alternatives (ie, potential for NIV modes on hospital ventilators versus BiPAP, or other).  Wayne Nichols said he only uses the NIV machine at night. Anesthesia team to  evaluate on the day of surgery.    VS:   1/256/22 ([facility]): Blood Pressure 106/80 11/15/2021 11:25 AM EST    Pulse 72 11/15/2021 11:25 AM EST    Temperature - -    Respiratory Rate - -    Oxygen Saturation - -    Inhaled Oxygen Concentration - -    Weight 56.7 kg (125 lb) 11/15/2021 11:25 AM EST    Height 160 cm (5' 2.99") 11/15/2021 11:25 AM EST    Body Mass Index 22.15 11/15/2021 11:25 AM EST     PROVIDERS: Charlott Rakes, MD is PCP  Tedra Coupe, MD is neurologist ([facility])   LABS: For day of surgery as indicated.   OTHER:  PFTs: As outlined in [facility]: 11/02/20: FEV1/predicted: 53% 07/01/2018: FEV1/predicted: 60% 05/03/2020: FVC 66% 05/03/21: FVC=61% so stable over 2 years    EKG: N/A   CV: N/A   Past Medical History:  Diagnosis Date   Complication of anesthesia    Patient has issues opening his mouth wide and taking deep breaths. Hx of spinal muscular atrophy. Noctural desaturations 2021 sleep study, non-invasive ventilation at night recommended.   Dependence on home ventilator [facility]) 04/2020   Patient uses Trilogy Ventilator at home   Hemorrhoids    external   Seasonal allergies    Spinal muscular atrophy ([place])    Wheelchair dependent     Past Surgical History:  Procedure Laterality Date   BACK SURGERY     MUSCLE BIOPSY     left leg   TENDON RELEASE Bilateral    WISDOM TOOTH EXTRACTION     extracted 3 teeth    MEDICATIONS: No current facility-administered medications for this encounter.    albuterol (VENTOLIN HFA) 108 (90 Base) MCG/ACT inhaler   clindamycin (CLEOCIN T) 1 % external solution   glycopyrrolate (ROBINUL) 1 MG tablet   ibuprofen (ADVIL,MOTRIN) 200 MG tablet   ketoconazole (NIZORAL) 2 % cream   Risdiplam (EVRYSDI) 0.75 MG/ML SOLR    Myra Gianotti, PA-C Surgical Short Stay/Anesthesiology [facility] Phone [phone] [facility] Phone [phone] 11/23/2021 1:59 PM

## 2021-11-23 NOTE — Anesthesia Preprocedure Evaluation (Addendum)
Anesthesia Evaluation  Patient identified by MRN, date of birth, ID band Patient awake    Reviewed: Allergy & Precautions, H&P , NPO status , Patient's Chart, lab work & pertinent test results  Airway Mallampati: III   Neck ROM: full  Mouth opening: Limited Mouth Opening  Dental   Pulmonary  Uses non-invasive respiratory assistance at night due to desaturations.   breath sounds clear to auscultation       Cardiovascular negative cardio ROS   Rhythm:regular Rate:Normal     Neuro/Psych PSYCHIATRIC DISORDERS Anxiety Depression Spinal muscular atrophy.  Uses wheelchair.    H/o spinal fusion from T3 to pelvis.    GI/Hepatic   Endo/Other    Renal/GU      Musculoskeletal   Abdominal   Peds  Hematology   Anesthesia Other Findings   Reproductive/Obstetrics                            Anesthesia Physical Anesthesia Plan  ASA: 3  Anesthesia Plan: General   Post-op Pain Management:    Induction: Intravenous  PONV Risk Score and Plan: 2 and Ondansetron, Dexamethasone and Treatment may vary due to age or medical condition  Airway Management Planned: Video Laryngoscope Planned and Oral ETT  Additional Equipment:   Intra-op Plan:   Post-operative Plan: Extubation in OR  Informed Consent: I have reviewed the patients History and Physical, chart, labs and discussed the procedure including the risks, benefits and alternatives for the proposed anesthesia with the patient or authorized representative who has indicated his/her understanding and acceptance.       Plan Discussed with: CRNA, Anesthesiologist and Surgeon  Anesthesia Plan Comments: (See PAT note written 11/23/2021 by Shonna Chock, PA-C. History of spinal muscular dystrophy. He uses non-invasive ventilator at night, advised to bring equipment if portable. )      Anesthesia Quick Evaluation

## 2021-11-24 ENCOUNTER — Ambulatory Visit (HOSPITAL_COMMUNITY)
Admission: RE | Admit: 2021-11-24 | Discharge: 2021-11-24 | Disposition: A | Discharge status: Home / Self Care | Payer: 59 | Source: Ambulatory Visit | Attending: Surgery | Admitting: Surgery

## 2021-11-24 ENCOUNTER — Other Ambulatory Visit: Payer: Self-pay

## 2021-11-24 ENCOUNTER — Ambulatory Visit (HOSPITAL_COMMUNITY): Payer: 59 | Admitting: Vascular Surgery

## 2021-11-24 ENCOUNTER — Encounter (HOSPITAL_COMMUNITY): Payer: Self-pay | Admitting: Surgery

## 2021-11-24 ENCOUNTER — Encounter (HOSPITAL_COMMUNITY)
Admission: RE | Disposition: A | Discharge status: Home / Self Care | Payer: Self-pay | Source: Ambulatory Visit | Attending: Surgery

## 2021-11-24 ENCOUNTER — Other Ambulatory Visit: Payer: Self-pay | Admitting: Surgery

## 2021-11-24 DIAGNOSIS — F419 Anxiety disorder, unspecified: Secondary | ICD-10-CM | POA: Insufficient documentation

## 2021-11-24 DIAGNOSIS — F32A Depression, unspecified: Secondary | ICD-10-CM | POA: Diagnosis not present

## 2021-11-24 DIAGNOSIS — K644 Residual hemorrhoidal skin tags: Secondary | ICD-10-CM | POA: Diagnosis not present

## 2021-11-24 DIAGNOSIS — K648 Other hemorrhoids: Secondary | ICD-10-CM | POA: Diagnosis not present

## 2021-11-24 DIAGNOSIS — Z993 Dependence on wheelchair: Secondary | ICD-10-CM | POA: Diagnosis not present

## 2021-11-24 DIAGNOSIS — G129 Spinal muscular atrophy, unspecified: Secondary | ICD-10-CM | POA: Insufficient documentation

## 2021-11-24 HISTORY — DX: Dependence on wheelchair: Z99.3

## 2021-11-24 HISTORY — DX: Other complications of anesthesia, initial encounter: T88.59XA

## 2021-11-24 HISTORY — PX: HEMORRHOID SURGERY: SHX153

## 2021-11-24 SURGERY — HEMORRHOIDECTOMY
Anesthesia: General | Site: Anus

## 2021-11-24 MED ORDER — ORAL CARE MOUTH RINSE: 15.0000 mL | Freq: Once | OROMUCOSAL | Status: AC

## 2021-11-24 MED ORDER — ACETAMINOPHEN 500 MG PO TABS
1000.0000 mg | ORAL_TABLET | ORAL | Status: AC
  Administered 2021-11-24: 1000 mg via ORAL
  Filled 2021-11-24: qty 2

## 2021-11-24 MED ORDER — CHLORHEXIDINE GLUCONATE 0.12 % MT SOLN
15.0000 mL | Freq: Once | OROMUCOSAL | Status: AC
  Administered 2021-11-24: 15 mL via OROMUCOSAL
  Filled 2021-11-24: qty 15

## 2021-11-24 MED ORDER — CHLORHEXIDINE GLUCONATE CLOTH 2 % EX PADS: 6.0000 | MEDICATED_PAD | Freq: Once | CUTANEOUS | Status: DC

## 2021-11-24 MED ORDER — FENTANYL CITRATE (PF) 100 MCG/2ML IJ SOLN: 25.0000 ug | INTRAMUSCULAR | Status: DC | PRN

## 2021-11-24 MED ORDER — HYDROCODONE-ACETAMINOPHEN 5-325 MG PO TABS: 1.0000 | ORAL_TABLET | Freq: Four times a day (QID) | ORAL | 0 refills | Status: DC | PRN

## 2021-11-24 MED ORDER — BUPIVACAINE LIPOSOME 1.3 % IJ SUSP
INTRAMUSCULAR | Status: AC
  Filled 2021-11-24: qty 20

## 2021-11-24 MED ORDER — DIBUCAINE (PERIANAL) 1 % EX OINT
TOPICAL_OINTMENT | CUTANEOUS | Status: AC
  Filled 2021-11-24: qty 28

## 2021-11-24 MED ORDER — OXYMETAZOLINE HCL 0.05 % NA SOLN
NASAL | Status: AC
  Filled 2021-11-24: qty 30

## 2021-11-24 MED ORDER — SUGAMMADEX SODIUM 200 MG/2ML IV SOLN
INTRAVENOUS | Status: DC | PRN
Start: 2021-11-24 — End: 2021-11-24
  Administered 2021-11-24: 500 mg via INTRAVENOUS

## 2021-11-24 MED ORDER — MIDAZOLAM HCL 2 MG/2ML IJ SOLN
INTRAMUSCULAR | Status: DC | PRN
Start: 2021-11-24 — End: 2021-11-24
  Administered 2021-11-24: 2 mg via INTRAVENOUS

## 2021-11-24 MED ORDER — BUPIVACAINE LIPOSOME 1.3 % IJ SUSP
INTRAMUSCULAR | Status: DC | PRN
Start: 2021-11-24 — End: 2021-11-24
  Administered 2021-11-24: 20 mL

## 2021-11-24 MED ORDER — FENTANYL CITRATE (PF) 250 MCG/5ML IJ SOLN
INTRAMUSCULAR | Status: DC | PRN
  Administered 2021-11-24: 50 ug via INTRAVENOUS

## 2021-11-24 MED ORDER — BUPIVACAINE-EPINEPHRINE (PF) 0.25% -1:200000 IJ SOLN
INTRAMUSCULAR | Status: AC
  Filled 2021-11-24: qty 30

## 2021-11-24 MED ORDER — ROCURONIUM BROMIDE 10 MG/ML (PF) SYRINGE
PREFILLED_SYRINGE | INTRAVENOUS | Status: DC | PRN
  Administered 2021-11-24: 40 mg via INTRAVENOUS

## 2021-11-24 MED ORDER — POVIDONE-IODINE 10 % EX OINT
TOPICAL_OINTMENT | CUTANEOUS | Status: AC
  Filled 2021-11-24: qty 28.35

## 2021-11-24 MED ORDER — OXYCODONE HCL 5 MG PO TABS: 5.0000 mg | ORAL_TABLET | Freq: Once | ORAL | Status: DC | PRN

## 2021-11-24 MED ORDER — PROPOFOL 10 MG/ML IV BOLUS
INTRAVENOUS | Status: DC | PRN
  Administered 2021-11-24: 30 mg via INTRAVENOUS
  Administered 2021-11-24: 100 mg via INTRAVENOUS

## 2021-11-24 MED ORDER — ONDANSETRON HCL 4 MG/2ML IJ SOLN
INTRAMUSCULAR | Status: DC | PRN
  Administered 2021-11-24: 4 mg via INTRAVENOUS

## 2021-11-24 MED ORDER — FENTANYL CITRATE (PF) 250 MCG/5ML IJ SOLN
INTRAMUSCULAR | Status: AC
  Filled 2021-11-24: qty 5

## 2021-11-24 MED ORDER — MIDAZOLAM HCL 2 MG/2ML IJ SOLN
INTRAMUSCULAR | Status: AC
  Filled 2021-11-24: qty 2

## 2021-11-24 MED ORDER — LACTATED RINGERS IV SOLN: INTRAVENOUS | Status: DC

## 2021-11-24 MED ORDER — SUCCINYLCHOLINE CHLORIDE 200 MG/10ML IV SOSY
PREFILLED_SYRINGE | INTRAVENOUS | Status: DC | PRN
Start: 2021-11-24 — End: 2021-11-24
  Administered 2021-11-24: 100 mg via INTRAVENOUS

## 2021-11-24 MED ORDER — LIDOCAINE 2% (20 MG/ML) 5 ML SYRINGE
INTRAMUSCULAR | Status: DC | PRN
  Administered 2021-11-24: 40 mg via INTRAVENOUS

## 2021-11-24 MED ORDER — OXYMETAZOLINE HCL 0.05 % NA SOLN
NASAL | Status: DC | PRN
  Administered 2021-11-24: 1 via NASAL

## 2021-11-24 MED ORDER — ONDANSETRON HCL 4 MG/2ML IJ SOLN: 4.0000 mg | Freq: Four times a day (QID) | INTRAMUSCULAR | Status: DC | PRN

## 2021-11-24 MED ORDER — DEXAMETHASONE SODIUM PHOSPHATE 10 MG/ML IJ SOLN
INTRAMUSCULAR | Status: DC | PRN
Start: 2021-11-24 — End: 2021-11-24
  Administered 2021-11-24: 10 mg via INTRAVENOUS

## 2021-11-24 MED ORDER — POVIDONE-IODINE 10 % EX OINT
TOPICAL_OINTMENT | CUTANEOUS | Status: DC | PRN
  Administered 2021-11-24: 1 via TOPICAL

## 2021-11-24 MED ORDER — OXYCODONE HCL 5 MG/5ML PO SOLN: 5.0000 mg | Freq: Once | ORAL | Status: DC | PRN

## 2021-11-24 MED ORDER — PROPOFOL 10 MG/ML IV BOLUS
INTRAVENOUS | Status: AC
  Filled 2021-11-24: qty 20

## 2021-11-24 SURGICAL SUPPLY — 32 items
BAG COUNTER SPONGE SURGICOUNT (BAG) ×2 IMPLANT
BAG SPNG CNTER NS LX DISP (BAG) ×1
CANISTER SUCT 3000ML PPV (MISCELLANEOUS) ×2 IMPLANT
COVER MAYO STAND STRL (DRAPES) ×1 IMPLANT
COVER SURGICAL LIGHT HANDLE (MISCELLANEOUS) ×2 IMPLANT
DRSG PAD ABDOMINAL 8X10 ST (GAUZE/BANDAGES/DRESSINGS) ×2 IMPLANT
GAUZE 4X4 16PLY ~~LOC~~+RFID DBL (SPONGE) ×2 IMPLANT
GAUZE SPONGE 4X4 12PLY STRL (GAUZE/BANDAGES/DRESSINGS) ×1 IMPLANT
GLOVE EUDERMIC 7 POWDERFREE (GLOVE) ×2 IMPLANT
GOWN STRL REUS W/ TWL LRG LVL3 (GOWN DISPOSABLE) ×1 IMPLANT
GOWN STRL REUS W/TWL 2XL LVL3 (GOWN DISPOSABLE) ×2 IMPLANT
GOWN STRL REUS W/TWL LRG LVL3 (GOWN DISPOSABLE) ×2
KIT BASIN OR (CUSTOM PROCEDURE TRAY) ×2 IMPLANT
KIT SIGMOIDOSCOPE (SET/KITS/TRAYS/PACK) IMPLANT
KIT TURNOVER KIT B (KITS) ×2 IMPLANT
NDL HYPO 25GX1X1/2 BEV (NEEDLE) ×1 IMPLANT
NEEDLE HYPO 25GX1X1/2 BEV (NEEDLE) ×2 IMPLANT
NS IRRIG 1000ML POUR BTL (IV SOLUTION) ×2 IMPLANT
PACK LITHOTOMY IV (CUSTOM PROCEDURE TRAY) ×2 IMPLANT
PAD ARMBOARD 7.5X6 YLW CONV (MISCELLANEOUS) ×4 IMPLANT
PENCIL SMOKE EVACUATOR (MISCELLANEOUS) ×2 IMPLANT
SHEARS HARMONIC 9CM CVD (BLADE) ×1 IMPLANT
SPECIMEN JAR SMALL (MISCELLANEOUS) ×2 IMPLANT
SURGILUBE 2OZ TUBE FLIPTOP (MISCELLANEOUS) ×2 IMPLANT
SUT CHROMIC 2 0 SH (SUTURE) ×1 IMPLANT
SUT CHROMIC 3 0 SH 27 (SUTURE) ×1 IMPLANT
SYR CONTROL 10ML LL (SYRINGE) ×2 IMPLANT
TOWEL GREEN STERILE (TOWEL DISPOSABLE) ×1 IMPLANT
TOWEL GREEN STERILE FF (TOWEL DISPOSABLE) ×2 IMPLANT
TUBE CONNECTING 12X1/4 (SUCTIONS) ×2 IMPLANT
UNDERPAD 30X36 HEAVY ABSORB (UNDERPADS AND DIAPERS) ×2 IMPLANT
YANKAUER SUCT BULB TIP NO VENT (SUCTIONS) ×2 IMPLANT

## 2021-11-24 NOTE — Progress Notes (Signed)
Per Dr. Chaney Malling, no pre-op labs needed.  Viviano Simas, RN

## 2021-11-24 NOTE — Interval H&P Note (Signed)
History and Physical Interval Note:  11/24/2021 2:39 PM  Wayne Nichols  has presented today for surgery, with the diagnosis of EXTERNAL HEMORRHOIDS .  The various methods of treatment have been discussed with the patient and family. After consideration of risks, benefits and other options for treatment, the patient has consented to  Procedure(s): EXTERNAL HEMORRHOIDECTOMY (N/A) as a surgical intervention.  The patient's history has been reviewed, patient examined, no change in status, stable for surgery.  I have reviewed the patient's chart and labs.  Questions were answered to the patient's satisfaction.     [name]

## 2021-11-24 NOTE — Op Note (Signed)
Wayne Nichols  22-Feb-1991   11/24/2021    PCP:  Charlott Rakes, MD   Surgeon: Kaylyn Lim, MD, FACS  Asst:  Candida Peeling, MD  Anes:  General by mask  Preop Dx: hemorrhoids Postop Dx: Three column internal and external hemorrhoidectomy  Procedure: EUA, three column excision of hemorrhoids with Harmonic Location Surgery: [facility] Complications: Airway difficulty; expedited procedure  EBL:   minimal cc  Drains: none  Description of Procedure:  The patient was taken to OR 8 .  After anesthesia tried oral and nasal intubation unsuccessfully, we opted to expedite a mask general that was administered and the patient was prepped  with betadine  and a timeout was performed.  The patient was in dorsal lithotomy.   Exparel 20 cc was injected around the anus.  Three areas were felt to be prominent.  These were grasped with a Babcock clamp and the Harmonic used to excise the underlying hemorrhoid.  3-0 chromic was used at the 5 oclock and 7 oclock positions.  Betadine ointment was massaged in to the anal canal.    The patient tolerated the procedure well and was taken to the PACU in stable condition.     Matt B. Hassell Done, [place], [facility], [place]

## 2021-11-24 NOTE — Anesthesia Procedure Notes (Addendum)
Procedure Name: General with mask airway Date/Time: 11/24/2021 3:03 PM Performed by: Drema Pry, CRNA Pre-anesthesia Checklist: Patient identified, Emergency Drugs available, Suction available and Patient being monitored Patient Re-evaluated:Patient Re-evaluated prior to induction Oxygen Delivery Method: Circle system utilized Preoxygenation: Pre-oxygenation with 100% oxygen Induction Type: IV induction and Rapid sequence Ventilation: Mask ventilation throughout procedure Airway Equipment and Method: Video-laryngoscopy and Fiberoptic brochoscope Difficulty Due To: Difficulty was anticipated and Difficult Airway- due to limited oral opening Future Recommendations: Recommend- induction with short-acting agent, and alternative techniques readily available and Recommend- awake intubation Comments: Difficult intubation due to limited oral opening: Induction with propofol and succinylcholine. Somewhat difficult to mask ventilate (patient intermittently air trapping); due to limited oral opening: unable to place oral airway, difficult to advance yankauer, unable to use glidescope. Mask ventilated while preparing for nasal fiberoptic intubation. Able to advance fiberoptic and ETT through left nare but unable to distinguish relevant anatomy due to collapsed tissue. Discussion with the surgeon and anesthesiologist about the brevity of case and decision was made to continue to general mask airway for the duration of the procedure. 100% FiO2, +ETCO2, bilateral chest rise, and BIS placed. Fleeta Emmer, MD at the bedside for the duration of the case.

## 2021-11-24 NOTE — Transfer of Care (Signed)
Immediate Anesthesia Transfer of Care Note  Patient: Wayne Nichols  Procedure(s) Performed: EXTERNAL HEMORRHOIDECTOMY  Patient Location: PACU  Anesthesia Type:General  Level of Consciousness: awake, alert  and patient cooperative  Airway & Oxygen Therapy: Patient Spontanous Breathing  Post-op Assessment: Report given to RN and Post -op Vital signs reviewed and stable  Post vital signs: Reviewed and stable  Last Vitals:  Vitals Value Taken Time  BP 128/102 11/24/21 1548  Temp 36.8 C 11/24/21 1548  Pulse 79 11/24/21 1553  Resp 15 11/24/21 1553  SpO2 100 % 11/24/21 1553  Vitals shown include unvalidated device data.  Last Pain:  Vitals:   11/24/21 1548  TempSrc:   PainSc: 0-No pain         Complications: No notable events documented.

## 2021-11-27 ENCOUNTER — Encounter (HOSPITAL_COMMUNITY): Payer: Self-pay | Admitting: Surgery

## 2021-11-27 LAB — SURGICAL PATHOLOGY

## 2021-11-27 NOTE — Anesthesia Postprocedure Evaluation (Signed)
Anesthesia Post Note  Patient: Wayne Nichols  Procedure(s) Performed: EXTERNAL HEMORRHOIDECTOMY (Anus)     Patient location during evaluation: PACU Anesthesia Type: General Level of consciousness: awake and alert Pain management: pain level controlled Vital Signs Assessment: post-procedure vital signs reviewed and stable Respiratory status: spontaneous breathing, nonlabored ventilation, respiratory function stable and patient connected to nasal cannula oxygen Cardiovascular status: blood pressure returned to baseline and stable Postop Assessment: no apparent nausea or vomiting Anesthetic complications: no   No notable events documented.  Last Vitals:  Vitals:   11/24/21 1604 11/24/21 1618  BP: (!) 138/100 (!) 125/95  Pulse: 71 79  Resp: 16 20  Temp:  (!) 36.4 C  SpO2: 100% 98%    Last Pain:  Vitals:   11/24/21 1618  TempSrc:   PainSc: 0-No pain                 Sukanya Goldblatt S

## 2021-12-06 DIAGNOSIS — G129 Spinal muscular atrophy, unspecified: Secondary | ICD-10-CM | POA: Diagnosis not present

## 2022-01-03 DIAGNOSIS — G129 Spinal muscular atrophy, unspecified: Secondary | ICD-10-CM | POA: Diagnosis not present

## 2022-01-05 ENCOUNTER — Encounter: Payer: Self-pay | Admitting: Family Medicine

## 2022-01-08 ENCOUNTER — Other Ambulatory Visit: Payer: Self-pay

## 2022-01-08 ENCOUNTER — Ambulatory Visit: Payer: 59 | Attending: Family Medicine | Admitting: Family Medicine

## 2022-01-08 ENCOUNTER — Encounter: Payer: Self-pay | Admitting: Family Medicine

## 2022-01-08 DIAGNOSIS — U071 COVID-19: Secondary | ICD-10-CM | POA: Diagnosis not present

## 2022-01-08 MED ORDER — HYDROCOD POLI-CHLORPHE POLI ER 10-8 MG/5ML PO SUER: 5.0000 mL | Freq: Two times a day (BID) | ORAL | 0 refills | Status: AC | PRN

## 2022-01-08 MED ORDER — PREDNISONE 20 MG PO TABS: 20.0000 mg | ORAL_TABLET | Freq: Every day | ORAL | 0 refills | Status: AC

## 2022-01-08 NOTE — Progress Notes (Signed)
? ?Virtual Visit via Telephone Note ? ?I connected with [name], on 01/08/2022 at 11:12 AM by telephone due to the COVID-19 pandemic and verified that I am speaking with the correct person using two identifiers. ?  ?Consent: ?I discussed the limitations, risks, security and privacy concerns of performing an evaluation and management service by telephone and the availability of in person appointments. I also discussed with the patient that there may be a patient responsible charge related to this service. The patient expressed understanding and agreed to proceed. ? ? ?Location of Patient: ?Home ? ?Location of Provider: ?Clinic ? ? ?Persons participating in Telemedicine visit: ?KEWON STATLER ?Dr. Alvis Lemmings ? ? ? ? ?History of Present Illness: ?Wayne Nichols is a 31 y.o. year old male with a history of spinal muscular atrophy, acne vulgaris, seasonal allergies, anxiety and depression. ?He took a home COVID test which came back positive.  Family members have been sick as well. ?Symptoms started 7 days ago with scratchy throat, headache, congestion, coughing up mucus but no sinus pressure, no dyspnea, no chest pain fever occurred the first day but has since resolved ?He is not vaccinated against COVID-19. ?He states he has improved slightly compared to when his symptoms first started but is not back to baseline. ?Past Medical History:  ?Diagnosis Date  ? Complication of anesthesia   ? Patient has issues opening his mouth wide and taking deep breaths. Hx of spinal muscular atrophy. Noctural desaturations 2021 sleep study, non-invasive ventilation at night recommended.  ? 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 Patient uses Trilogy Ventilator at home  ? Hemorrhoids   ? external  ? Seasonal allergies   ? Spinal muscular atrophy ([facility])   ? Wheelchair dependent   ? ?Allergies  ?Allergen Reactions  ? Amoxicillin Hives  ? Penicillins Hives and Rash  ? ? ?Current Outpatient Medications on File Prior to Visit   ?Medication Sig Dispense Refill  ? albuterol (VENTOLIN HFA) 108 (90 Base) MCG/ACT inhaler Inhale 2 puffs into the lungs every 6 (six) hours as needed for wheezing. 18 g 3  ? clindamycin (CLEOCIN T) 1 % external solution Apply 1 application topically 2 (two) times daily as needed (acne).    ? glycopyrrolate (ROBINUL) 1 MG tablet Take 1 mg by mouth at bedtime.    ? ibuprofen (ADVIL,MOTRIN) 200 MG tablet Take 200-400 mg by mouth every 6 (six) hours as needed for moderate pain.     ? ketoconazole (NIZORAL) 2 % cream Apply 1 application topically daily as needed for irritation.    ? Risdiplam (EVRYSDI) 0.75 MG/ML SOLR Take 4.95 mg by mouth daily. 6.6 mL    ? ?No current facility-administered medications on file prior to visit.  ? ? ?ROS: ?See HPI ? ?Observations/Objective: ?Awake, alert, oriented x3 ?Not in acute distress ?Normal mood ? ? ?CMP Latest Ref Rng & Units 01/07/2019 06/05/2017 04/17/2017  ?Glucose 65 - 99 mg/dL 81 83 79  ?BUN 6 - 20 mg/dL 8 12 16   ?Creatinine 0.76 - 1.27 mg/dL ) 9.70(Y) 6.37(C)  ?Sodium 134 - 144 mmol/L 144 143 142  ?Potassium 3.5 - 5.2 mmol/L 3.9 4.1 4.3  ?Chloride 96 - 106 mmol/L 102 105 104  ?CO2 20 - 29 mmol/L 25 26 19(L)  ?Calcium 8.7 - 10.2 mg/dL 5.88(F 9.4 9.2  ?Total Protein 6.0 - 8.5 g/dL - 7.4 7.5  ?Total Bilirubin 0.0 - 1.2 mg/dL - 0.2 0.4  ?Alkaline Phos 39 - 117 IU/L - 46 53  ?  AST 0 - 40 IU/L - 58(H) 85(H)  ?ALT 0 - 44 IU/L - 125(H) 191(H)  ? ? ?Lipid Panel  ?   ?Component Value Date/Time  ? CHOL 189 04/17/2017 1001  ? TRIG 66 04/17/2017 1001  ? HDL 56 04/17/2017 1001  ? CHOLHDL 3.4 04/17/2017 1001  ? CHOLHDL 2.8 05/01/2016 1012  ? VLDL 10 05/01/2016 1012  ? LDLCALC 120 (H) 04/17/2017 1001  ? LABVLDL 13 04/17/2017 1001  ? ? ?Lab Results  ?Component Value Date  ? HGBA1C 5.3 08/22/2015  ? ? ?Assessment and Plan: ?1. COVID-19 virus infection ?He is past 5 days since symptom onset and hence will not be candidate for the oral antiviral medications ?Advised that should he develop  dyspnea or chest pain he needs to go to the ED ?- predniSONE (DELTASONE) 20 MG tablet; Take 1 tablet (20 mg total) by mouth daily with breakfast.  Dispense: 5 tablet; Refill: 0 ?- chlorpheniramine-HYDROcodone (TUSSIONEX PENNKINETIC ER) 10-8 MG/5ML; Take 5 mLs by mouth every 12 (twelve) hours as needed for cough.  Dispense: 115 mL; Refill: 0 ? ? ?Follow Up Instructions: ?Keep previously scheduled appointment ?  ?I discussed the assessment and treatment plan with the patient. The patient was provided an opportunity to ask questions and all were answered. The patient agreed with the plan and demonstrated an understanding of the instructions. ?  ?The patient was advised to call back or seek an in-person evaluation if the symptoms worsen or if the condition fails to improve as anticipated. ? ? ? ? ?I provided 9 minutes total of non-face-to-face time during this encounter. ? ? ?Hoy Register, MD, FAAFP. ?[facility] ?[place], [place] ?[phone]   ?01/08/2022, 11:12 AM ? ?

## 2022-02-03 DIAGNOSIS — G129 Spinal muscular atrophy, unspecified: Secondary | ICD-10-CM | POA: Diagnosis not present

## 2022-02-27 ENCOUNTER — Telehealth: Payer: 59 | Admitting: Family Medicine

## 2022-02-27 ENCOUNTER — Encounter: Payer: Self-pay | Admitting: Family Medicine

## 2022-02-27 ENCOUNTER — Ambulatory Visit: Payer: Self-pay | Admitting: *Deleted

## 2022-02-27 ENCOUNTER — Ambulatory Visit (HOSPITAL_COMMUNITY)
Admission: EM | Admit: 2022-02-27 | Discharge: 2022-02-27 | Disposition: A | Discharge status: Home / Self Care | Payer: 59

## 2022-02-27 ENCOUNTER — Encounter (HOSPITAL_COMMUNITY): Payer: Self-pay

## 2022-02-27 DIAGNOSIS — R319 Hematuria, unspecified: Secondary | ICD-10-CM | POA: Diagnosis not present

## 2022-02-27 DIAGNOSIS — R300 Dysuria: Secondary | ICD-10-CM

## 2022-02-27 LAB — POCT URINALYSIS DIPSTICK, ED / UC
Glucose, UA: NEGATIVE mg/dL
Ketones, ur: 40 mg/dL — AB
Leukocytes,Ua: NEGATIVE
Nitrite: NEGATIVE
Protein, ur: NEGATIVE mg/dL
Specific Gravity, Urine: 1.02 (ref 1.005–1.030)
Urobilinogen, UA: 0.2 mg/dL (ref 0.0–1.0)
pH: 6 (ref 5.0–8.0)

## 2022-02-27 MED ORDER — TAMSULOSIN HCL 0.4 MG PO CAPS: 0.4000 mg | ORAL_CAPSULE | Freq: Every day | ORAL | 0 refills | Status: AC

## 2022-02-27 NOTE — Discharge Instructions (Addendum)
Take medication as prescribed ?Drink plenty of fluids ?Can take Ibuprofen as needed for pain ?Keep follow up with primary care next week ? ?

## 2022-02-27 NOTE — ED Provider Notes (Signed)
?[facility] ? ? ? ?CSN: [id number] ?Arrival date & time: 02/27/22  1731 ? ? ?  ? ?History   ?Chief Complaint ?Chief Complaint  ?Patient presents with  ? Hematuria  ? ? ?HPI ?Wayne Nichols is a 31 y.o. male.  ? ?Pt complains of left sided flank pain and hematuria that started 1-2 days ago.  Reports pain is mild.  He denies nausea, vomiting, abdominal pain, fever.  Reports increased urinary frequency.  He has taken nothing for the sx.  Pt reports he has an appointment with PCP next Monday.  ? ? ?Past Medical History:  ?Diagnosis Date  ? Complication of anesthesia   ? Patient has issues opening his mouth wide and taking deep breaths. Hx of spinal muscular atrophy. Noctural desaturations 2021 sleep study, non-invasive ventilation at night recommended.  ? 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 Patient uses Trilogy Ventilator at home  ? Hemorrhoids   ? external  ? Seasonal allergies   ? Spinal muscular atrophy (HCC)   ? Wheelchair dependent   ? ? ?Patient Active Problem List  ? Diagnosis Date Noted  ? Seasonal allergies 04/17/2017  ? Acne 05/01/2016  ? Anxiety and depression 05/01/2016  ? Muscular atrophy, spinal (HCC) 03/01/2015  ? Spinal muscular atrophy (HCC) 10/08/2011  ? ? ?Past Surgical History:  ?Procedure Laterality Date  ? BACK SURGERY    ? HEMORRHOID SURGERY N/A 11/24/2021  ? Procedure: EXTERNAL HEMORRHOIDECTOMY;  Surgeon: Luretha Murphy, MD;  Location: [facility] OR;  Service: General;  Laterality: N/A;  ? MUSCLE BIOPSY    ? left leg  ? TENDON RELEASE Bilateral   ? WISDOM TOOTH EXTRACTION    ? extracted 3 teeth  ? ? ? ? ? ?Home Medications   ? ?Prior to Admission medications   ?Medication Sig Start Date End Date Taking? Authorizing Provider  ?tamsulosin (FLOMAX) 0.4 MG CAPS capsule Take 1 capsule (0.4 mg total) by mouth daily for 14 days. 02/27/22 03/13/22 Yes Ward, Tylene Fantasia, PA-C  ?albuterol (VENTOLIN HFA) 108 (90 Base) MCG/ACT inhaler Inhale 2 puffs into the lungs every 6 (six) hours as needed for  wheezing. 10/28/20   Hoy Register, MD  ?chlorpheniramine-HYDROcodone (TUSSIONEX PENNKINETIC ER) 10-8 MG/5ML Take 5 mLs by mouth every 12 (twelve) hours as needed for cough. 01/08/22   Hoy Register, MD  ?clindamycin (CLEOCIN T) 1 % external solution Apply 1 application topically 2 (two) times daily as needed (acne).    [provider]  ?glycopyrrolate (ROBINUL) 1 MG tablet Take 1 mg by mouth at bedtime. 11/10/21   [provider]  ?ibuprofen (ADVIL,MOTRIN) 200 MG tablet Take 200-400 mg by mouth every 6 (six) hours as needed for moderate pain.     [provider]  ?ketoconazole (NIZORAL) 2 % cream Apply 1 application topically daily as needed for irritation.    [provider]  ?loratadine (CLARITIN) 10 MG tablet Take 10 mg by mouth daily. 01/05/22   [provider]  ?predniSONE (DELTASONE) 20 MG tablet Take 1 tablet (20 mg total) by mouth daily with breakfast. 01/08/22   Hoy Register, MD  ?Risdiplam (EVRYSDI) 0.75 MG/ML SOLR Take 4.95 mg by mouth daily. 6.6 mL 09/23/19   [provider]  ? ? ?Family History ?Family History  ?Problem Relation Age of Onset  ? Stroke Maternal Grandmother   ? Heart disease Maternal Grandmother   ? ? ?Social History ?Social History  ? ?Tobacco Use  ? Smoking status: Never  ? Smokeless tobacco:  Never  ?Vaping Use  ? Vaping Use: Never used  ?Substance Use Topics  ? Alcohol use: Yes  ?  Comment: Occasional Beer  ? Drug use: No  ? ? ? ?Allergies   ?Amoxicillin and Penicillins ? ? ?Review of Systems ?Review of Systems  ?Constitutional:  Negative for chills and fever.  ?HENT:  Negative for ear pain and sore throat.   ?Eyes:  Negative for pain and visual disturbance.  ?Respiratory:  Negative for cough and shortness of breath.   ?Cardiovascular:  Negative for chest pain and palpitations.  ?Gastrointestinal:  Negative for abdominal pain, nausea and vomiting.  ?Genitourinary:  Positive for flank pain, frequency and hematuria. Negative for  dysuria.  ?Musculoskeletal:  Negative for arthralgias and back pain.  ?Skin:  Negative for color change and rash.  ?Neurological:  Negative for seizures and syncope.  ?All other systems reviewed and are negative. ? ? ?Physical Exam ?Triage Vital Signs ?ED Triage Vitals [02/27/22 1816]  ?Enc Vitals Group  ?   BP 124/81  ?   Pulse Rate 75  ?   Resp 18  ?   Temp 97.7 ?F (36.5 ?C)  ?   Temp Source Oral  ?   SpO2 97 %  ?   Weight   ?   Height   ?   Head Circumference   ?   Peak Flow   ?   Pain Score 0  ?   Pain Loc   ?   Pain Edu?   ?   Excl. in GC?   ? ?No data found. ? ?Updated Vital Signs ?BP 124/81 (BP Location: Left Arm)   Pulse 75   Temp 97.7 ?F (36.5 ?C) (Oral)   Resp 18   SpO2 97%  ? ?Visual Acuity ?Right Eye Distance:   ?Left Eye Distance:   ?Bilateral Distance:   ? ?Right Eye Near:   ?Left Eye Near:    ?Bilateral Near:    ? ?Physical Exam ?Vitals and nursing note reviewed.  ?Constitutional:   ?   General: He is not in acute distress. ?   Appearance: He is well-developed.  ?HENT:  ?   Head: Normocephalic and atraumatic.  ?Eyes:  ?   Conjunctiva/sclera: Conjunctivae normal.  ?Cardiovascular:  ?   Rate and Rhythm: Normal rate and regular rhythm.  ?   Heart sounds: No murmur heard. ?Pulmonary:  ?   Effort: Pulmonary effort is normal. No respiratory distress.  ?   Breath sounds: Normal breath sounds.  ?Abdominal:  ?   Palpations: Abdomen is soft.  ?   Tenderness: There is no abdominal tenderness. There is left CVA tenderness (mild).  ?Musculoskeletal:     ?   General: No swelling.  ?   Cervical back: Neck supple.  ?Skin: ?   General: Skin is warm and dry.  ?   Capillary Refill: Capillary refill takes less than 2 seconds.  ?Neurological:  ?   Mental Status: He is alert.  ?Psychiatric:     ?   Mood and Affect: Mood normal.  ? ? ? ?UC Treatments / Results  ?Labs ?(all labs ordered are listed, but only abnormal results are displayed) ?Labs Reviewed  ?POCT URINALYSIS DIPSTICK, ED / UC - Abnormal; Notable for the  following components:  ?    Result Value  ? Bilirubin Urine SMALL (*)   ? Ketones, ur 40 (*)   ? Hgb urine dipstick LARGE (*)   ? All other components within normal limits  ?  POCT URINALYSIS DIPSTICK, ED / UC  ? ? ?EKG ? ? ?Radiology ?No results found. ? ?Procedures ?Procedures (including critical care time) ? ?Medications Ordered in UC ?Medications - No data to display ? ?Initial Impression / Assessment and Plan / UC Course  ?I have reviewed the triage vital signs and the nursing notes. ? ?Pertinent labs & imaging results that were available during my care of the patient were reviewed by me and considered in my medical decision making (see chart for details). ? ?  ? ?Sx likely related to kidney stone, no UTI on UA.  Will prescribed Flomax.  Advised increased fluid intake and NSAIDS for pain control.  Pt reports pain is very mild at this time.  Advised to keep appointment next week with PCP for further evaluation if symptoms have not resolved.  ED precautions given.  ?Final Clinical Impressions(s) / UC Diagnoses  ? ?Final diagnoses:  ?Hematuria, unspecified type  ? ? ? ?Discharge Instructions   ? ?  ?Take medication as prescribed ?Drink plenty of fluids ?Can take Ibuprofen as needed for pain ?Keep follow up with primary care next week ? ? ? ?ED Prescriptions   ? ? Medication Sig Dispense Auth. Provider  ? tamsulosin (FLOMAX) 0.4 MG CAPS capsule Take 1 capsule (0.4 mg total) by mouth daily for 14 days. 14 capsule Ward, Tylene FantasiaJessica Z, PA-C  ? ?  ? ?PDMP not reviewed this encounter. ?  ?Ward, Tylene FantasiaJessica Z, PA-C ?02/27/22 1851 ? ?

## 2022-02-27 NOTE — Progress Notes (Signed)
[place]  ? ?Male UTI  ? ?Message sent on this EV  ?

## 2022-02-27 NOTE — ED Triage Notes (Signed)
1-2 day h/o left sided flank pain and urinary frequency with an onset of hematuria today. No meds taken. ?

## 2022-02-27 NOTE — Telephone Encounter (Signed)
? ? ?  Chief Complaint: urinating blood, requesting to be seen today  ?Symptoms: sensation to urinate but could not go . Then urinated blood.  ?Frequency: today with blood in urine , urine sx 1-2 days  ?Pertinent Negatives: Patient denies urinating few drops no fever, no severe pain ?Disposition: [] ED /[x] Urgent Care (no appt availability in office) / [] Appointment(In office/virtual)/ []  [facility]/ [] Home Care/ [] Refused Recommended Disposition /[] [facility]/ []  Follow-up with PCP ?Additional Notes:  ? ?Patient requesting to be seen today due to worsening sx blood in urine. Appt already scheduled for 03/05/22 . Recommended UC/ ED. No available appt today. Appt not canceled for 03/05/22. Please advise  ? ? ? ?Reason for Disposition ? Blood in urine  (Exception: could be normal menstrual bleeding) ? ?Answer Assessment - Initial Assessment Questions ?1. COLOR of URINE: "Describe the color of the urine."  (e.g., tea-colored, pink, red, blood clots, bloody) ?    Urinating blood today  ?2. ONSET: "When did the bleeding start?"  ?    Today  ?3. EPISODES: "How many times has there been blood in the urine?" or "How many times today?" ?    1 ?4. PAIN with URINATION: "Is there any pain with passing your urine?" If Yes, ask: "How bad is the pain?"  (Scale 1-10; or mild, moderate, severe) ?   - MILD - complains slightly about urination hurting ?   - MODERATE - interferes with normal activities   ?   - SEVERE - excruciating, unwilling or unable to urinate because of the pain  ?    Sensation to urinate but could not go then urinated blood ?5. FEVER: "Do you have a fever?" If Yes, ask: "What is your temperature, how was it measured, and when did it start?" ?    denies ?6. ASSOCIATED SYMPTOMS: "Are you passing urine more frequently than usual?" ?    na ?7. OTHER SYMPTOMS: "Do you have any other symptoms?" (e.g., back/flank pain, abdominal pain, vomiting) ?    Urinating blood ?8. PREGNANCY: "Is there any  chance you are pregnant?" "When was your last menstrual period?" ?    na ? ?Protocols used: Urine - Blood In-A-AH ? ?

## 2022-02-28 NOTE — Telephone Encounter (Signed)
Patient went to [facility] and was seen.  ?

## 2022-03-05 ENCOUNTER — Ambulatory Visit: Payer: 59 | Admitting: Family Medicine

## 2022-03-05 DIAGNOSIS — G129 Spinal muscular atrophy, unspecified: Secondary | ICD-10-CM | POA: Diagnosis not present

## 2022-04-02 ENCOUNTER — Other Ambulatory Visit: Payer: Self-pay | Admitting: Family Medicine

## 2022-04-02 DIAGNOSIS — J302 Other seasonal allergic rhinitis: Secondary | ICD-10-CM

## 2022-04-03 NOTE — Telephone Encounter (Signed)
Requested medication (s) are due for refill today: Unsure, no details on Historical Provider rx's  Requested medication (s) are on the active medication list: yes  Last refill:  01/05/22  Future visit scheduled: no, seen 01/08/22, canceled 03/05/22, labs are from 2020.  Notes to clinic:  Historical Provider, please assess.       Requested Prescriptions  Pending Prescriptions Disp Refills   loratadine (CLARITIN) 10 MG tablet [Pharmacy Med Name: LORATADINE 10 MG TABLET] 90 tablet 1    Sig: TAKE 1 TABLET BY MOUTH EVERY DAY     Ear, Nose, and Throat:  Antihistamines 2 Failed - 04/02/2022 12:08 AM      Failed - Cr in normal range and within 360 days    Creat  Date Value Ref Range Status  05/01/2016 0.28 (L) 0.60 - 1.35 mg/dL Final   Creatinine, Ser  Date Value Ref Range Status  01/07/2019 0.26 (L) 0.76 - 1.27 mg/dL Final         Passed - Valid encounter within last 12 months    Recent Outpatient Visits           2 months ago COVID-19 virus infection   [facility] J[place], Odette Horns, MD   10 months ago Spinal muscular atrophy [facility])   [facility] Hoy Register, MD   2 years ago Seasonal allergies   [facility] [place], Odette Horns, MD   3 years ago Cough   [facility] Hoy Register, MD   4 years ago Spinal muscular atrophy [facility])   [facility] Hoy Register, MD

## 2022-04-05 DIAGNOSIS — G129 Spinal muscular atrophy, unspecified: Secondary | ICD-10-CM | POA: Diagnosis not present

## 2022-06-08 ENCOUNTER — Encounter: Payer: Self-pay | Admitting: Family Medicine

## 2022-07-12 ENCOUNTER — Other Ambulatory Visit: Payer: Self-pay | Admitting: Family Medicine

## 2022-07-12 DIAGNOSIS — J302 Other seasonal allergic rhinitis: Secondary | ICD-10-CM

## 2022-07-12 NOTE — Telephone Encounter (Signed)
Requested medication (s) are due for refill today: yes  Requested medication (s) are on the active medication list: yes  Last refill:  04/03/22 #90/0  Future visit scheduled: no  Notes to clinic:  Unable to refill per protocol due to failed labs, no updated results.      Requested Prescriptions  Pending Prescriptions Disp Refills   loratadine (CLARITIN) 10 MG tablet [Pharmacy Med Name: LORATADINE 10 MG TABLET] 90 tablet 0    Sig: TAKE 1 TABLET BY MOUTH EVERY DAY     Ear, Nose, and Throat:  Antihistamines 2 Failed - 07/12/2022 12:09 AM      Failed - Cr in normal range and within 360 days    Creat  Date Value Ref Range Status  05/01/2016 0.28 (L) 0.60 - 1.35 mg/dL Final   Creatinine, Ser  Date Value Ref Range Status  01/07/2019 0.26 (L) 0.76 - 1.27 mg/dL Final         Passed - Valid encounter within last 12 months    Recent Outpatient Visits           6 months ago COVID-19 virus infection   [place], Charlane Ferretti, MD   1 year ago Spinal muscular atrophy [facility])   [place], Enobong, MD   2 years ago Seasonal allergies   [place], Charlane Ferretti, MD   3 years ago Cough   [facility] Charlott Rakes, MD   5 years ago Spinal muscular atrophy [facility])   [facility] Charlott Rakes, MD

## 2022-08-17 ENCOUNTER — Encounter: Payer: Self-pay | Admitting: Family Medicine

## 2022-11-05 DIAGNOSIS — G129 Spinal muscular atrophy, unspecified: Secondary | ICD-10-CM | POA: Diagnosis not present

## 2022-11-06 ENCOUNTER — Other Ambulatory Visit: Payer: Self-pay | Admitting: Family Medicine

## 2022-11-06 DIAGNOSIS — J302 Other seasonal allergic rhinitis: Secondary | ICD-10-CM

## 2022-11-06 MED ORDER — LORATADINE 10 MG PO TABS: 10.0000 mg | ORAL_TABLET | Freq: Every day | ORAL | 3 refills | Status: AC

## 2022-11-12 DIAGNOSIS — G129 Spinal muscular atrophy, unspecified: Secondary | ICD-10-CM | POA: Diagnosis not present

## 2022-11-12 DIAGNOSIS — G8250 Quadriplegia, unspecified: Secondary | ICD-10-CM | POA: Diagnosis not present

## 2022-12-06 DIAGNOSIS — G129 Spinal muscular atrophy, unspecified: Secondary | ICD-10-CM | POA: Diagnosis not present

## 2022-12-17 DIAGNOSIS — L700 Acne vulgaris: Secondary | ICD-10-CM | POA: Diagnosis not present

## 2022-12-17 DIAGNOSIS — L738 Other specified follicular disorders: Secondary | ICD-10-CM | POA: Diagnosis not present

## 2022-12-17 DIAGNOSIS — L732 Hidradenitis suppurativa: Secondary | ICD-10-CM | POA: Diagnosis not present

## 2022-12-17 DIAGNOSIS — G129 Spinal muscular atrophy, unspecified: Secondary | ICD-10-CM | POA: Diagnosis not present

## 2022-12-17 DIAGNOSIS — L219 Seborrheic dermatitis, unspecified: Secondary | ICD-10-CM | POA: Diagnosis not present

## 2022-12-18 DIAGNOSIS — G129 Spinal muscular atrophy, unspecified: Secondary | ICD-10-CM | POA: Diagnosis not present

## 2022-12-19 DIAGNOSIS — G129 Spinal muscular atrophy, unspecified: Secondary | ICD-10-CM | POA: Diagnosis not present

## 2022-12-20 DIAGNOSIS — G129 Spinal muscular atrophy, unspecified: Secondary | ICD-10-CM | POA: Diagnosis not present

## 2022-12-21 DIAGNOSIS — G129 Spinal muscular atrophy, unspecified: Secondary | ICD-10-CM | POA: Diagnosis not present

## 2022-12-24 DIAGNOSIS — G129 Spinal muscular atrophy, unspecified: Secondary | ICD-10-CM | POA: Diagnosis not present

## 2022-12-25 DIAGNOSIS — G129 Spinal muscular atrophy, unspecified: Secondary | ICD-10-CM | POA: Diagnosis not present

## 2022-12-26 DIAGNOSIS — G129 Spinal muscular atrophy, unspecified: Secondary | ICD-10-CM | POA: Diagnosis not present

## 2022-12-27 DIAGNOSIS — G129 Spinal muscular atrophy, unspecified: Secondary | ICD-10-CM | POA: Diagnosis not present

## 2022-12-28 DIAGNOSIS — G129 Spinal muscular atrophy, unspecified: Secondary | ICD-10-CM | POA: Diagnosis not present

## 2022-12-31 DIAGNOSIS — G129 Spinal muscular atrophy, unspecified: Secondary | ICD-10-CM | POA: Diagnosis not present

## 2023-01-01 DIAGNOSIS — G129 Spinal muscular atrophy, unspecified: Secondary | ICD-10-CM | POA: Diagnosis not present

## 2023-01-02 DIAGNOSIS — G129 Spinal muscular atrophy, unspecified: Secondary | ICD-10-CM | POA: Diagnosis not present

## 2023-01-03 DIAGNOSIS — G129 Spinal muscular atrophy, unspecified: Secondary | ICD-10-CM | POA: Diagnosis not present

## 2023-01-04 DIAGNOSIS — G129 Spinal muscular atrophy, unspecified: Secondary | ICD-10-CM | POA: Diagnosis not present

## 2023-01-07 DIAGNOSIS — G129 Spinal muscular atrophy, unspecified: Secondary | ICD-10-CM | POA: Diagnosis not present

## 2023-01-08 DIAGNOSIS — G129 Spinal muscular atrophy, unspecified: Secondary | ICD-10-CM | POA: Diagnosis not present

## 2023-01-09 DIAGNOSIS — G129 Spinal muscular atrophy, unspecified: Secondary | ICD-10-CM | POA: Diagnosis not present

## 2023-01-10 DIAGNOSIS — G129 Spinal muscular atrophy, unspecified: Secondary | ICD-10-CM | POA: Diagnosis not present

## 2023-01-11 DIAGNOSIS — G129 Spinal muscular atrophy, unspecified: Secondary | ICD-10-CM | POA: Diagnosis not present

## 2023-01-14 DIAGNOSIS — G129 Spinal muscular atrophy, unspecified: Secondary | ICD-10-CM | POA: Diagnosis not present

## 2023-01-15 DIAGNOSIS — G129 Spinal muscular atrophy, unspecified: Secondary | ICD-10-CM | POA: Diagnosis not present

## 2023-01-16 DIAGNOSIS — G129 Spinal muscular atrophy, unspecified: Secondary | ICD-10-CM | POA: Diagnosis not present

## 2023-01-17 DIAGNOSIS — G129 Spinal muscular atrophy, unspecified: Secondary | ICD-10-CM | POA: Diagnosis not present

## 2023-01-18 DIAGNOSIS — G129 Spinal muscular atrophy, unspecified: Secondary | ICD-10-CM | POA: Diagnosis not present

## 2023-01-21 DIAGNOSIS — G129 Spinal muscular atrophy, unspecified: Secondary | ICD-10-CM | POA: Diagnosis not present

## 2023-01-22 DIAGNOSIS — G129 Spinal muscular atrophy, unspecified: Secondary | ICD-10-CM | POA: Diagnosis not present

## 2023-01-23 DIAGNOSIS — G129 Spinal muscular atrophy, unspecified: Secondary | ICD-10-CM | POA: Diagnosis not present

## 2023-01-24 DIAGNOSIS — G129 Spinal muscular atrophy, unspecified: Secondary | ICD-10-CM | POA: Diagnosis not present

## 2023-01-25 DIAGNOSIS — G129 Spinal muscular atrophy, unspecified: Secondary | ICD-10-CM | POA: Diagnosis not present

## 2023-01-28 DIAGNOSIS — G129 Spinal muscular atrophy, unspecified: Secondary | ICD-10-CM | POA: Diagnosis not present

## 2023-01-29 DIAGNOSIS — G129 Spinal muscular atrophy, unspecified: Secondary | ICD-10-CM | POA: Diagnosis not present

## 2023-01-30 DIAGNOSIS — G129 Spinal muscular atrophy, unspecified: Secondary | ICD-10-CM | POA: Diagnosis not present

## 2023-01-31 DIAGNOSIS — G129 Spinal muscular atrophy, unspecified: Secondary | ICD-10-CM | POA: Diagnosis not present

## 2023-02-01 DIAGNOSIS — G129 Spinal muscular atrophy, unspecified: Secondary | ICD-10-CM | POA: Diagnosis not present

## 2023-02-04 DIAGNOSIS — G129 Spinal muscular atrophy, unspecified: Secondary | ICD-10-CM | POA: Diagnosis not present

## 2023-02-05 DIAGNOSIS — G129 Spinal muscular atrophy, unspecified: Secondary | ICD-10-CM | POA: Diagnosis not present

## 2023-02-06 DIAGNOSIS — G129 Spinal muscular atrophy, unspecified: Secondary | ICD-10-CM | POA: Diagnosis not present

## 2023-02-07 DIAGNOSIS — G129 Spinal muscular atrophy, unspecified: Secondary | ICD-10-CM | POA: Diagnosis not present

## 2023-02-08 DIAGNOSIS — G129 Spinal muscular atrophy, unspecified: Secondary | ICD-10-CM | POA: Diagnosis not present

## 2023-02-11 DIAGNOSIS — G129 Spinal muscular atrophy, unspecified: Secondary | ICD-10-CM | POA: Diagnosis not present

## 2023-02-12 DIAGNOSIS — G129 Spinal muscular atrophy, unspecified: Secondary | ICD-10-CM | POA: Diagnosis not present

## 2023-02-13 DIAGNOSIS — G129 Spinal muscular atrophy, unspecified: Secondary | ICD-10-CM | POA: Diagnosis not present

## 2023-02-14 DIAGNOSIS — G129 Spinal muscular atrophy, unspecified: Secondary | ICD-10-CM | POA: Diagnosis not present

## 2023-02-15 DIAGNOSIS — G129 Spinal muscular atrophy, unspecified: Secondary | ICD-10-CM | POA: Diagnosis not present

## 2023-02-18 DIAGNOSIS — G129 Spinal muscular atrophy, unspecified: Secondary | ICD-10-CM | POA: Diagnosis not present

## 2023-02-19 DIAGNOSIS — G129 Spinal muscular atrophy, unspecified: Secondary | ICD-10-CM | POA: Diagnosis not present

## 2023-02-20 ENCOUNTER — Ambulatory Visit: Payer: 59 | Attending: Family Medicine | Admitting: Family Medicine

## 2023-02-20 ENCOUNTER — Encounter: Payer: Self-pay | Admitting: Family Medicine

## 2023-02-20 VITALS — BP 118/77 | HR 80

## 2023-02-20 DIAGNOSIS — G129 Spinal muscular atrophy, unspecified: Secondary | ICD-10-CM | POA: Diagnosis not present

## 2023-02-20 DIAGNOSIS — Z13228 Encounter for screening for other metabolic disorders: Secondary | ICD-10-CM | POA: Diagnosis not present

## 2023-02-20 DIAGNOSIS — Z0001 Encounter for general adult medical examination with abnormal findings: Secondary | ICD-10-CM | POA: Diagnosis not present

## 2023-02-20 NOTE — Progress Notes (Signed)
Subjective:  Patient ID: Wayne Nichols, male    DOB: 03/23/1991  Age: 32 y.o. MRN: [id number]  CC: Annual Exam   HPI Wayne Nichols is a 32 y.o. year old male with a history of spinal muscular atrophy, acne vulgaris, seasonal allergies, anxiety and depression.   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 His muscular atrophy is managed by neurology at [facility].  He has random pain on his right thorax which is absent at the moment. Usually 2-3/10.  He sees a Education officer, community and an ophthalmologist regularly. Past Medical History:  Diagnosis Date   Complication of anesthesia    Patient has issues opening his mouth wide and taking deep breaths. Hx of spinal muscular atrophy. Noctural desaturations 2021 sleep study, non-invasive ventilation at night recommended.   Dependence on home ventilator [facility]) 04/2020   Patient uses Trilogy Ventilator at home   Hemorrhoids    external   Seasonal allergies    Spinal muscular atrophy [facility])    Wheelchair dependent     Past Surgical History:  Procedure Laterality Date   BACK SURGERY     HEMORRHOID SURGERY N/A 11/24/2021   Procedure: EXTERNAL HEMORRHOIDECTOMY;  Surgeon: Luretha Murphy, MD;  Location: [facility] OR;  Service: General;  Laterality: N/A;   MUSCLE BIOPSY     left leg   TENDON RELEASE Bilateral    WISDOM TOOTH EXTRACTION     extracted 3 teeth    Family History  Problem Relation Age of Onset   Stroke Maternal Grandmother    Heart disease Maternal Grandmother     Social History   Socioeconomic History   Marital status: Single    Spouse name: Not on file   Number of children: Not on file   Years of education: Not on file   Highest education level: Not on file  Occupational History   Not on file  Tobacco Use   Smoking status: Never   Smokeless tobacco: Never  Vaping Use   Vaping Use: Never used  Substance and Sexual Activity   Alcohol use: Yes    Comment:  Occasional Beer   Drug use: No   Sexual activity: Not Currently  Other Topics Concern   Not on file  Social History Narrative   Not on file   Social Determinants of Health   Financial Resource Strain: Not on file  Food Insecurity: Not on file  Transportation Needs: Not on file  Physical Activity: Not on file  Stress: Not on file  Social Connections: Not on file    Allergies  Allergen Reactions   Amoxicillin Hives   Penicillins Hives and Rash    Outpatient Medications Prior to Visit  Medication Sig Dispense Refill   albuterol (VENTOLIN HFA) 108 (90 Base) MCG/ACT inhaler Inhale 2 puffs into the lungs every 6 (six) hours as needed for wheezing. 18 g 3   clindamycin (CLEOCIN T) 1 % external solution Apply 1 application topically 2 (two) times daily as needed (acne).     ketoconazole (NIZORAL) 2 % cream Apply 1 application topically daily as needed for irritation.     loratadine (CLARITIN) 10 MG tablet Take 1 tablet (10 mg total) by mouth daily. 90 tablet 3   Risdiplam (EVRYSDI) 0.75 MG/ML SOLR Take 4.95 mg by mouth daily. 6.6 mL     SODIUM FLUORIDE, DENTAL RINSE, 0.2 % SOLN Take by mouth as directed.     tretinoin (RETIN-A) 0.025 % cream Apply a  small amount (pea size) twice a week , at night time to affected areas     chlorpheniramine-HYDROcodone (TUSSIONEX PENNKINETIC ER) 10-8 MG/5ML Take 5 mLs by mouth every 12 (twelve) hours as needed for cough. (Patient not taking: Reported on 02/20/2023) 115 mL 0   glycopyrrolate (ROBINUL) 1 MG tablet Take 1 mg by mouth at bedtime. (Patient not taking: Reported on 02/20/2023)     ibuprofen (ADVIL,MOTRIN) 200 MG tablet Take 200-400 mg by mouth every 6 (six) hours as needed for moderate pain.  (Patient not taking: Reported on 02/20/2023)     predniSONE (DELTASONE) 20 MG tablet Take 1 tablet (20 mg total) by mouth daily with breakfast. (Patient not taking: Reported on 02/20/2023) 5 tablet 0   No facility-administered medications prior to visit.      ROS Review of Systems  Constitutional:  Negative for activity change and appetite change.  HENT:  Negative for sinus pressure and sore throat.   Respiratory:  Negative for chest tightness, shortness of breath and wheezing.   Cardiovascular:  Negative for chest pain and palpitations.  Gastrointestinal:  Negative for abdominal distention, abdominal pain and constipation.  Genitourinary: Negative.   Musculoskeletal: Negative.   Psychiatric/Behavioral:  Negative for behavioral problems and dysphoric mood.    Objective:  BP 118/77   Pulse 80   SpO2 97%      02/20/2023    3:30 PM 02/27/2022    6:16 PM 11/24/2021    4:18 PM  BP/Weight  Systolic BP 118 124 125  Diastolic BP 77 81 95      Physical Exam Constitutional:      Appearance: He is well-developed.     Comments: Sitting comfortably in a scooter  HENT:     Right Ear: Tympanic membrane normal.     Left Ear: Tympanic membrane normal.     Nose: Nose normal.     Mouth/Throat:     Mouth: Mucous membranes are moist.  Cardiovascular:     Rate and Rhythm: Normal rate.     Heart sounds: Normal heart sounds. No murmur heard. Pulmonary:     Effort: Pulmonary effort is normal.     Breath sounds: Normal breath sounds. No wheezing or rales.  Chest:     Chest wall: No tenderness.  Abdominal:     General: Bowel sounds are normal. There is no distension.     Palpations: Abdomen is soft. There is no mass.     Tenderness: There is no abdominal tenderness.  Musculoskeletal:     Right lower leg: No edema.     Left lower leg: No edema.     Comments: Reduced range of motion of lower extremities  Neurological:     Mental Status: He is alert and oriented to person, place, and time.  Psychiatric:        Mood and Affect: Mood normal.        Latest Ref Rng & Units 01/07/2019    3:07 PM 06/05/2017   10:43 AM 04/17/2017   10:01 AM  CMP  Glucose 65 - 99 mg/dL 81  83  79   BUN 6 - 20 mg/dL 8  12  16    Creatinine 0.76 - 1.27 mg/dL  1.61  0.96  0.45   Sodium 134 - 144 mmol/L 144  143  142   Potassium 3.5 - 5.2 mmol/L 3.9  4.1  4.3   Chloride 96 - 106 mmol/L 102  105  104   CO2 20 - 29 mmol/L  25  26  19    Calcium 8.7 - 10.2 mg/dL 82.9  9.4  9.2   Total Protein 6.0 - 8.5 g/dL  7.4  7.5   Total Bilirubin 0.0 - 1.2 mg/dL  0.2  0.4   Alkaline Phos 39 - 117 IU/L  46  53   AST 0 - 40 IU/L  58  85   ALT 0 - 44 IU/L  125  191     Lipid Panel     Component Value Date/Time   CHOL 189 04/17/2017 1001   TRIG 66 04/17/2017 1001   HDL 56 04/17/2017 1001   CHOLHDL 3.4 04/17/2017 1001   CHOLHDL 2.8 05/01/2016 1012   VLDL 10 05/01/2016 1012   LDLCALC 120 (H) 04/17/2017 1001    CBC    Component Value Date/Time   WBC 8.1 01/07/2019 1507   WBC 3.2 (L) 08/22/2015 0937   RBC 5.12 01/07/2019 1507   RBC 5.35 08/22/2015 0937   HGB 13.5 01/07/2019 1507   HCT 42.6 01/07/2019 1507   PLT 251 01/07/2019 1507   MCV 83 01/07/2019 1507   MCH 26.4 (L) 01/07/2019 1507   MCH 26.9 08/22/2015 0937   MCHC 31.7 01/07/2019 1507   MCHC 32.0 08/22/2015 0937   RDW 13.0 01/07/2019 1507   LYMPHSABS 1.9 01/07/2019 1507   MONOABS 0.2 08/22/2015 0937   EOSABS 0.0 01/07/2019 1507   BASOSABS 0.0 01/07/2019 1507    Lab Results  Component Value Date   HGBA1C 5.3 08/22/2015    Assessment & Plan:  1. Annual visit for general adult medical examination with abnormal findings Declined Tdap Continue with yearly physical, preventive health screening Exercise is limited due to the fact that he is wheelchair bound  2. Screening for metabolic disorder - LP+Non-HDL Cholesterol - CMP14+EGFR - CBC with Differential/Platelet - Hemoglobin A1c  3. Spinal muscular atrophy (HCC) Management as per muscular dystrophy Clinic    No orders of the defined types were placed in this encounter.   Follow-up: Return in about 1 year (around 02/20/2024) for CPE/ Preventive Health Exam.       Hoy Register, MD, FAAFP. [facility] [place], [place] [phone]   02/20/2023, 5:29 PM

## 2023-02-20 NOTE — Patient Instructions (Signed)
Health Maintenance, Male Adopting a healthy lifestyle and getting preventive care are important in promoting health and wellness. Ask your health care provider about: The right schedule for you to have regular tests and exams. Things you can do on your own to prevent diseases and keep yourself healthy. What should I know about diet, weight, and exercise? Eat a healthy diet  Eat a diet that includes plenty of vegetables, fruits, low-fat dairy products, and lean protein. Do not eat a lot of foods that are high in solid fats, added sugars, or sodium. Maintain a healthy weight Body mass index (BMI) is a measurement that can be used to identify possible weight problems. It estimates body fat based on height and weight. Your health care provider can help determine your BMI and help you achieve or maintain a healthy weight. Get regular exercise Get regular exercise. This is one of the most important things you can do for your health. Most adults should: Exercise for at least 150 minutes each week. The exercise should increase your heart rate and make you sweat (moderate-intensity exercise). Do strengthening exercises at least twice a week. This is in addition to the moderate-intensity exercise. Spend less time sitting. Even light physical activity can be beneficial. Watch cholesterol and blood lipids Have your blood tested for lipids and cholesterol at 33 years of age, then have this test every 5 years. You may need to have your cholesterol levels checked more often if: Your lipid or cholesterol levels are high. You are older than 33 years of age. You are at high risk for heart disease. What should I know about cancer screening? Many types of cancers can be detected early and may often be prevented. Depending on your health history and family history, you may need to have cancer screening at various ages. This may include screening for: Colorectal cancer. Prostate cancer. Skin cancer. Lung  cancer. What should I know about heart disease, diabetes, and high blood pressure? Blood pressure and heart disease High blood pressure causes heart disease and increases the risk of stroke. This is more likely to develop in people who have high blood pressure readings or are overweight. Talk with your health care provider about your target blood pressure readings. Have your blood pressure checked: Every 3-5 years if you are 18-39 years of age. Every year if you are 40 years old or older. If you are between the ages of 65 and 75 and are a current or former smoker, ask your health care provider if you should have a one-time screening for abdominal aortic aneurysm (AAA). Diabetes Have regular diabetes screenings. This checks your fasting blood sugar level. Have the screening done: Once every three years after age 45 if you are at a normal weight and have a low risk for diabetes. More often and at a younger age if you are overweight or have a high risk for diabetes. What should I know about preventing infection? Hepatitis B If you have a higher risk for hepatitis B, you should be screened for this virus. Talk with your health care provider to find out if you are at risk for hepatitis B infection. Hepatitis C Blood testing is recommended for: Everyone born from 1945 through 1965. Anyone with known risk factors for hepatitis C. Sexually transmitted infections (STIs) You should be screened each year for STIs, including gonorrhea and chlamydia, if: You are sexually active and are younger than 33 years of age. You are older than 33 years of age and your   health care provider tells you that you are at risk for this type of infection. Your sexual activity has changed since you were last screened, and you are at increased risk for chlamydia or gonorrhea. Ask your health care provider if you are at risk. Ask your health care provider about whether you are at high risk for HIV. Your health care provider  may recommend a prescription medicine to help prevent HIV infection. If you choose to take medicine to prevent HIV, you should first get tested for HIV. You should then be tested every 3 months for as long as you are taking the medicine. Follow these instructions at home: Alcohol use Do not drink alcohol if your health care provider tells you not to drink. If you drink alcohol: Limit how much you have to 0-2 drinks a day. Know how much alcohol is in your drink. In the [place] one drink equals one 12 oz bottle of beer (355 mL), one 5 oz glass of wine (148 mL), or one 1 oz glass of hard liquor (44 mL). Lifestyle Do not use any products that contain nicotine or tobacco. These products include cigarettes, chewing tobacco, and vaping devices, such as e-cigarettes. If you need help quitting, ask your health care provider. Do not use street drugs. Do not share needles. Ask your health care provider for help if you need support or information about quitting drugs. General instructions Schedule regular health, dental, and eye exams. Stay current with your vaccines. Tell your health care provider if: You often feel depressed. You have ever been abused or do not feel safe at home. Summary Adopting a healthy lifestyle and getting preventive care are important in promoting health and wellness. Follow your health care provider's instructions about healthy diet, exercising, and getting tested or screened for diseases. Follow your health care provider's instructions on monitoring your cholesterol and blood pressure. This information is not intended to replace advice given to you by your health care provider. Make sure you discuss any questions you have with your health care provider. Document Revised: 02/27/2021 Document Reviewed: 02/27/2021 Elsevier Patient Education  2023 Elsevier Inc.  

## 2023-02-21 DIAGNOSIS — G129 Spinal muscular atrophy, unspecified: Secondary | ICD-10-CM | POA: Diagnosis not present

## 2023-02-21 LAB — CBC WITH DIFFERENTIAL/PLATELET
Basophils Absolute: 0 10*3/uL (ref 0.0–0.2)
Basos: 1 %
EOS (ABSOLUTE): 0.1 10*3/uL (ref 0.0–0.4)
Eos: 1 %
Hematocrit: 46.4 % (ref 37.5–51.0)
Hemoglobin: 14.9 g/dL (ref 13.0–17.7)
Immature Grans (Abs): 0 10*3/uL (ref 0.0–0.1)
Immature Granulocytes: 0 %
Lymphocytes Absolute: 1.7 10*3/uL (ref 0.7–3.1)
Lymphs: 38 %
MCH: 27 pg (ref 26.6–33.0)
MCHC: 32.1 g/dL (ref 31.5–35.7)
MCV: 84 fL (ref 79–97)
Monocytes Absolute: 0.3 10*3/uL (ref 0.1–0.9)
Monocytes: 7 %
Neutrophils Absolute: 2.4 10*3/uL (ref 1.4–7.0)
Neutrophils: 53 %
Platelets: 272 10*3/uL (ref 150–450)
RBC: 5.51 x10E6/uL (ref 4.14–5.80)
RDW: 13.2 % (ref 11.6–15.4)
WBC: 4.6 10*3/uL (ref 3.4–10.8)

## 2023-02-21 LAB — CMP14+EGFR
ALT: 65 IU/L — ABNORMAL HIGH (ref 0–44)
AST: 39 IU/L (ref 0–40)
Albumin/Globulin Ratio: 1.8 (ref 1.2–2.2)
Albumin: 5 g/dL (ref 4.1–5.1)
Alkaline Phosphatase: 62 IU/L (ref 44–121)
BUN/Creatinine Ratio: 50 — ABNORMAL HIGH (ref 9–20)
BUN: 14 mg/dL (ref 6–20)
Bilirubin Total: 0.3 mg/dL (ref 0.0–1.2)
CO2: 22 mmol/L (ref 20–29)
Calcium: 9.8 mg/dL (ref 8.7–10.2)
Chloride: 103 mmol/L (ref 96–106)
Creatinine, Ser: 0.28 mg/dL — ABNORMAL LOW (ref 0.76–1.27)
Globulin, Total: 2.8 g/dL (ref 1.5–4.5)
Glucose: 80 mg/dL (ref 70–99)
Potassium: 4.6 mmol/L (ref 3.5–5.2)
Sodium: 142 mmol/L (ref 134–144)
Total Protein: 7.8 g/dL (ref 6.0–8.5)
eGFR: 166 mL/min/{1.73_m2} (ref >59)

## 2023-02-21 LAB — HEMOGLOBIN A1C
Est. average glucose Bld gHb Est-mCnc: 108 mg/dL
Hgb A1c MFr Bld: 5.4 % (ref 4.8–5.6)

## 2023-02-21 LAB — LP+NON-HDL CHOLESTEROL
Cholesterol, Total: 234 mg/dL — ABNORMAL HIGH (ref 100–199)
HDL: 60 mg/dL (ref >39)
LDL Chol Calc (NIH): 164 mg/dL — ABNORMAL HIGH (ref 0–99)
Total Non-HDL-Chol (LDL+VLDL): 174 mg/dL — ABNORMAL HIGH (ref 0–129)
Triglycerides: 62 mg/dL (ref 0–149)
VLDL Cholesterol Cal: 10 mg/dL (ref 5–40)

## 2023-02-22 DIAGNOSIS — G129 Spinal muscular atrophy, unspecified: Secondary | ICD-10-CM | POA: Diagnosis not present

## 2023-02-25 DIAGNOSIS — G129 Spinal muscular atrophy, unspecified: Secondary | ICD-10-CM | POA: Diagnosis not present

## 2023-02-26 DIAGNOSIS — G129 Spinal muscular atrophy, unspecified: Secondary | ICD-10-CM | POA: Diagnosis not present

## 2023-02-27 DIAGNOSIS — G129 Spinal muscular atrophy, unspecified: Secondary | ICD-10-CM | POA: Diagnosis not present

## 2023-02-28 DIAGNOSIS — G129 Spinal muscular atrophy, unspecified: Secondary | ICD-10-CM | POA: Diagnosis not present

## 2023-03-01 DIAGNOSIS — G129 Spinal muscular atrophy, unspecified: Secondary | ICD-10-CM | POA: Diagnosis not present

## 2023-03-04 DIAGNOSIS — G129 Spinal muscular atrophy, unspecified: Secondary | ICD-10-CM | POA: Diagnosis not present

## 2023-03-05 DIAGNOSIS — G129 Spinal muscular atrophy, unspecified: Secondary | ICD-10-CM | POA: Diagnosis not present

## 2023-03-06 DIAGNOSIS — G129 Spinal muscular atrophy, unspecified: Secondary | ICD-10-CM | POA: Diagnosis not present

## 2023-03-07 DIAGNOSIS — G129 Spinal muscular atrophy, unspecified: Secondary | ICD-10-CM | POA: Diagnosis not present

## 2023-03-08 DIAGNOSIS — G129 Spinal muscular atrophy, unspecified: Secondary | ICD-10-CM | POA: Diagnosis not present

## 2023-03-11 DIAGNOSIS — G129 Spinal muscular atrophy, unspecified: Secondary | ICD-10-CM | POA: Diagnosis not present

## 2023-03-12 DIAGNOSIS — G129 Spinal muscular atrophy, unspecified: Secondary | ICD-10-CM | POA: Diagnosis not present

## 2023-03-13 DIAGNOSIS — G129 Spinal muscular atrophy, unspecified: Secondary | ICD-10-CM | POA: Diagnosis not present

## 2023-03-14 DIAGNOSIS — G129 Spinal muscular atrophy, unspecified: Secondary | ICD-10-CM | POA: Diagnosis not present

## 2023-03-15 DIAGNOSIS — G129 Spinal muscular atrophy, unspecified: Secondary | ICD-10-CM | POA: Diagnosis not present

## 2023-03-18 DIAGNOSIS — G129 Spinal muscular atrophy, unspecified: Secondary | ICD-10-CM | POA: Diagnosis not present

## 2023-03-19 DIAGNOSIS — G129 Spinal muscular atrophy, unspecified: Secondary | ICD-10-CM | POA: Diagnosis not present

## 2023-03-20 DIAGNOSIS — G129 Spinal muscular atrophy, unspecified: Secondary | ICD-10-CM | POA: Diagnosis not present

## 2023-03-21 DIAGNOSIS — G129 Spinal muscular atrophy, unspecified: Secondary | ICD-10-CM | POA: Diagnosis not present

## 2023-03-22 DIAGNOSIS — G129 Spinal muscular atrophy, unspecified: Secondary | ICD-10-CM | POA: Diagnosis not present

## 2023-03-25 DIAGNOSIS — G129 Spinal muscular atrophy, unspecified: Secondary | ICD-10-CM | POA: Diagnosis not present

## 2023-03-26 DIAGNOSIS — G129 Spinal muscular atrophy, unspecified: Secondary | ICD-10-CM | POA: Diagnosis not present

## 2023-03-27 DIAGNOSIS — G129 Spinal muscular atrophy, unspecified: Secondary | ICD-10-CM | POA: Diagnosis not present

## 2023-03-28 DIAGNOSIS — G129 Spinal muscular atrophy, unspecified: Secondary | ICD-10-CM | POA: Diagnosis not present

## 2023-03-29 DIAGNOSIS — G129 Spinal muscular atrophy, unspecified: Secondary | ICD-10-CM | POA: Diagnosis not present

## 2023-04-01 DIAGNOSIS — G129 Spinal muscular atrophy, unspecified: Secondary | ICD-10-CM | POA: Diagnosis not present

## 2023-04-02 DIAGNOSIS — G129 Spinal muscular atrophy, unspecified: Secondary | ICD-10-CM | POA: Diagnosis not present

## 2023-04-03 DIAGNOSIS — G129 Spinal muscular atrophy, unspecified: Secondary | ICD-10-CM | POA: Diagnosis not present

## 2023-04-04 DIAGNOSIS — G129 Spinal muscular atrophy, unspecified: Secondary | ICD-10-CM | POA: Diagnosis not present

## 2023-04-05 DIAGNOSIS — G129 Spinal muscular atrophy, unspecified: Secondary | ICD-10-CM | POA: Diagnosis not present

## 2023-04-06 DIAGNOSIS — G129 Spinal muscular atrophy, unspecified: Secondary | ICD-10-CM | POA: Diagnosis not present

## 2023-04-08 DIAGNOSIS — G129 Spinal muscular atrophy, unspecified: Secondary | ICD-10-CM | POA: Diagnosis not present

## 2023-04-09 DIAGNOSIS — G129 Spinal muscular atrophy, unspecified: Secondary | ICD-10-CM | POA: Diagnosis not present

## 2023-04-10 DIAGNOSIS — G129 Spinal muscular atrophy, unspecified: Secondary | ICD-10-CM | POA: Diagnosis not present

## 2023-04-11 DIAGNOSIS — G129 Spinal muscular atrophy, unspecified: Secondary | ICD-10-CM | POA: Diagnosis not present

## 2023-04-12 DIAGNOSIS — G129 Spinal muscular atrophy, unspecified: Secondary | ICD-10-CM | POA: Diagnosis not present

## 2023-04-15 DIAGNOSIS — G129 Spinal muscular atrophy, unspecified: Secondary | ICD-10-CM | POA: Diagnosis not present

## 2023-04-16 DIAGNOSIS — G129 Spinal muscular atrophy, unspecified: Secondary | ICD-10-CM | POA: Diagnosis not present

## 2023-04-17 DIAGNOSIS — G129 Spinal muscular atrophy, unspecified: Secondary | ICD-10-CM | POA: Diagnosis not present

## 2023-04-18 DIAGNOSIS — G129 Spinal muscular atrophy, unspecified: Secondary | ICD-10-CM | POA: Diagnosis not present

## 2023-04-19 DIAGNOSIS — G129 Spinal muscular atrophy, unspecified: Secondary | ICD-10-CM | POA: Diagnosis not present

## 2023-04-22 DIAGNOSIS — G129 Spinal muscular atrophy, unspecified: Secondary | ICD-10-CM | POA: Diagnosis not present

## 2023-04-23 DIAGNOSIS — G129 Spinal muscular atrophy, unspecified: Secondary | ICD-10-CM | POA: Diagnosis not present

## 2023-04-24 DIAGNOSIS — G129 Spinal muscular atrophy, unspecified: Secondary | ICD-10-CM | POA: Diagnosis not present

## 2023-04-25 DIAGNOSIS — G129 Spinal muscular atrophy, unspecified: Secondary | ICD-10-CM | POA: Diagnosis not present

## 2023-04-26 DIAGNOSIS — G129 Spinal muscular atrophy, unspecified: Secondary | ICD-10-CM | POA: Diagnosis not present

## 2023-04-29 DIAGNOSIS — G129 Spinal muscular atrophy, unspecified: Secondary | ICD-10-CM | POA: Diagnosis not present

## 2023-04-30 DIAGNOSIS — G129 Spinal muscular atrophy, unspecified: Secondary | ICD-10-CM | POA: Diagnosis not present

## 2023-05-01 DIAGNOSIS — G129 Spinal muscular atrophy, unspecified: Secondary | ICD-10-CM | POA: Diagnosis not present

## 2023-05-01 DIAGNOSIS — G8250 Quadriplegia, unspecified: Secondary | ICD-10-CM | POA: Diagnosis not present

## 2023-05-02 DIAGNOSIS — G129 Spinal muscular atrophy, unspecified: Secondary | ICD-10-CM | POA: Diagnosis not present

## 2023-05-03 DIAGNOSIS — G129 Spinal muscular atrophy, unspecified: Secondary | ICD-10-CM | POA: Diagnosis not present

## 2023-05-06 DIAGNOSIS — G129 Spinal muscular atrophy, unspecified: Secondary | ICD-10-CM | POA: Diagnosis not present

## 2023-05-07 DIAGNOSIS — G129 Spinal muscular atrophy, unspecified: Secondary | ICD-10-CM | POA: Diagnosis not present

## 2023-05-08 DIAGNOSIS — G129 Spinal muscular atrophy, unspecified: Secondary | ICD-10-CM | POA: Diagnosis not present

## 2023-05-09 DIAGNOSIS — G129 Spinal muscular atrophy, unspecified: Secondary | ICD-10-CM | POA: Diagnosis not present

## 2023-05-10 DIAGNOSIS — G129 Spinal muscular atrophy, unspecified: Secondary | ICD-10-CM | POA: Diagnosis not present

## 2023-05-13 DIAGNOSIS — G129 Spinal muscular atrophy, unspecified: Secondary | ICD-10-CM | POA: Diagnosis not present

## 2023-05-14 DIAGNOSIS — G129 Spinal muscular atrophy, unspecified: Secondary | ICD-10-CM | POA: Diagnosis not present

## 2023-05-15 DIAGNOSIS — G129 Spinal muscular atrophy, unspecified: Secondary | ICD-10-CM | POA: Diagnosis not present

## 2023-05-16 DIAGNOSIS — G129 Spinal muscular atrophy, unspecified: Secondary | ICD-10-CM | POA: Diagnosis not present

## 2023-05-17 DIAGNOSIS — G129 Spinal muscular atrophy, unspecified: Secondary | ICD-10-CM | POA: Diagnosis not present

## 2023-05-20 DIAGNOSIS — G129 Spinal muscular atrophy, unspecified: Secondary | ICD-10-CM | POA: Diagnosis not present

## 2023-05-21 DIAGNOSIS — G129 Spinal muscular atrophy, unspecified: Secondary | ICD-10-CM | POA: Diagnosis not present

## 2023-05-22 DIAGNOSIS — G129 Spinal muscular atrophy, unspecified: Secondary | ICD-10-CM | POA: Diagnosis not present

## 2023-05-23 DIAGNOSIS — G129 Spinal muscular atrophy, unspecified: Secondary | ICD-10-CM | POA: Diagnosis not present

## 2023-05-24 DIAGNOSIS — G129 Spinal muscular atrophy, unspecified: Secondary | ICD-10-CM | POA: Diagnosis not present

## 2023-05-27 DIAGNOSIS — G129 Spinal muscular atrophy, unspecified: Secondary | ICD-10-CM | POA: Diagnosis not present

## 2023-05-28 DIAGNOSIS — G129 Spinal muscular atrophy, unspecified: Secondary | ICD-10-CM | POA: Diagnosis not present

## 2023-05-29 DIAGNOSIS — G129 Spinal muscular atrophy, unspecified: Secondary | ICD-10-CM | POA: Diagnosis not present

## 2023-05-30 DIAGNOSIS — G129 Spinal muscular atrophy, unspecified: Secondary | ICD-10-CM | POA: Diagnosis not present

## 2023-05-31 DIAGNOSIS — G129 Spinal muscular atrophy, unspecified: Secondary | ICD-10-CM | POA: Diagnosis not present

## 2023-06-01 DIAGNOSIS — G129 Spinal muscular atrophy, unspecified: Secondary | ICD-10-CM | POA: Diagnosis not present

## 2023-06-01 DIAGNOSIS — G8250 Quadriplegia, unspecified: Secondary | ICD-10-CM | POA: Diagnosis not present

## 2023-06-03 DIAGNOSIS — G129 Spinal muscular atrophy, unspecified: Secondary | ICD-10-CM | POA: Diagnosis not present

## 2023-06-04 DIAGNOSIS — G129 Spinal muscular atrophy, unspecified: Secondary | ICD-10-CM | POA: Diagnosis not present

## 2023-06-05 DIAGNOSIS — G129 Spinal muscular atrophy, unspecified: Secondary | ICD-10-CM | POA: Diagnosis not present

## 2023-06-06 DIAGNOSIS — G129 Spinal muscular atrophy, unspecified: Secondary | ICD-10-CM | POA: Diagnosis not present

## 2023-06-07 DIAGNOSIS — G129 Spinal muscular atrophy, unspecified: Secondary | ICD-10-CM | POA: Diagnosis not present

## 2023-06-10 DIAGNOSIS — G129 Spinal muscular atrophy, unspecified: Secondary | ICD-10-CM | POA: Diagnosis not present

## 2023-06-11 DIAGNOSIS — G129 Spinal muscular atrophy, unspecified: Secondary | ICD-10-CM | POA: Diagnosis not present

## 2023-06-12 DIAGNOSIS — G129 Spinal muscular atrophy, unspecified: Secondary | ICD-10-CM | POA: Diagnosis not present

## 2023-06-13 DIAGNOSIS — G129 Spinal muscular atrophy, unspecified: Secondary | ICD-10-CM | POA: Diagnosis not present

## 2023-06-14 DIAGNOSIS — G129 Spinal muscular atrophy, unspecified: Secondary | ICD-10-CM | POA: Diagnosis not present

## 2023-06-17 DIAGNOSIS — G129 Spinal muscular atrophy, unspecified: Secondary | ICD-10-CM | POA: Diagnosis not present

## 2023-06-18 DIAGNOSIS — G129 Spinal muscular atrophy, unspecified: Secondary | ICD-10-CM | POA: Diagnosis not present

## 2023-06-19 DIAGNOSIS — G129 Spinal muscular atrophy, unspecified: Secondary | ICD-10-CM | POA: Diagnosis not present

## 2023-06-20 DIAGNOSIS — G129 Spinal muscular atrophy, unspecified: Secondary | ICD-10-CM | POA: Diagnosis not present

## 2023-06-21 DIAGNOSIS — G129 Spinal muscular atrophy, unspecified: Secondary | ICD-10-CM | POA: Diagnosis not present

## 2023-06-24 DIAGNOSIS — G129 Spinal muscular atrophy, unspecified: Secondary | ICD-10-CM | POA: Diagnosis not present

## 2023-06-25 DIAGNOSIS — G129 Spinal muscular atrophy, unspecified: Secondary | ICD-10-CM | POA: Diagnosis not present

## 2023-06-26 DIAGNOSIS — G129 Spinal muscular atrophy, unspecified: Secondary | ICD-10-CM | POA: Diagnosis not present

## 2023-06-27 DIAGNOSIS — G129 Spinal muscular atrophy, unspecified: Secondary | ICD-10-CM | POA: Diagnosis not present

## 2023-06-28 DIAGNOSIS — G129 Spinal muscular atrophy, unspecified: Secondary | ICD-10-CM | POA: Diagnosis not present

## 2023-07-01 DIAGNOSIS — G129 Spinal muscular atrophy, unspecified: Secondary | ICD-10-CM | POA: Diagnosis not present

## 2023-07-02 DIAGNOSIS — G129 Spinal muscular atrophy, unspecified: Secondary | ICD-10-CM | POA: Diagnosis not present

## 2023-07-02 DIAGNOSIS — G8250 Quadriplegia, unspecified: Secondary | ICD-10-CM | POA: Diagnosis not present

## 2023-07-03 DIAGNOSIS — G129 Spinal muscular atrophy, unspecified: Secondary | ICD-10-CM | POA: Diagnosis not present

## 2023-07-04 DIAGNOSIS — G129 Spinal muscular atrophy, unspecified: Secondary | ICD-10-CM | POA: Diagnosis not present

## 2023-07-05 DIAGNOSIS — G129 Spinal muscular atrophy, unspecified: Secondary | ICD-10-CM | POA: Diagnosis not present

## 2023-07-07 DIAGNOSIS — G129 Spinal muscular atrophy, unspecified: Secondary | ICD-10-CM | POA: Diagnosis not present

## 2023-07-08 DIAGNOSIS — G129 Spinal muscular atrophy, unspecified: Secondary | ICD-10-CM | POA: Diagnosis not present

## 2023-07-09 DIAGNOSIS — G129 Spinal muscular atrophy, unspecified: Secondary | ICD-10-CM | POA: Diagnosis not present

## 2023-07-10 DIAGNOSIS — G129 Spinal muscular atrophy, unspecified: Secondary | ICD-10-CM | POA: Diagnosis not present

## 2023-07-11 DIAGNOSIS — G129 Spinal muscular atrophy, unspecified: Secondary | ICD-10-CM | POA: Diagnosis not present

## 2023-07-12 DIAGNOSIS — G129 Spinal muscular atrophy, unspecified: Secondary | ICD-10-CM | POA: Diagnosis not present

## 2023-07-15 DIAGNOSIS — G129 Spinal muscular atrophy, unspecified: Secondary | ICD-10-CM | POA: Diagnosis not present

## 2023-07-16 DIAGNOSIS — G129 Spinal muscular atrophy, unspecified: Secondary | ICD-10-CM | POA: Diagnosis not present

## 2023-07-17 DIAGNOSIS — G129 Spinal muscular atrophy, unspecified: Secondary | ICD-10-CM | POA: Diagnosis not present

## 2023-07-18 DIAGNOSIS — G129 Spinal muscular atrophy, unspecified: Secondary | ICD-10-CM | POA: Diagnosis not present

## 2023-07-19 DIAGNOSIS — G129 Spinal muscular atrophy, unspecified: Secondary | ICD-10-CM | POA: Diagnosis not present

## 2023-07-22 DIAGNOSIS — G129 Spinal muscular atrophy, unspecified: Secondary | ICD-10-CM | POA: Diagnosis not present

## 2023-07-23 DIAGNOSIS — G129 Spinal muscular atrophy, unspecified: Secondary | ICD-10-CM | POA: Diagnosis not present

## 2023-07-24 DIAGNOSIS — G129 Spinal muscular atrophy, unspecified: Secondary | ICD-10-CM | POA: Diagnosis not present

## 2023-07-25 DIAGNOSIS — G129 Spinal muscular atrophy, unspecified: Secondary | ICD-10-CM | POA: Diagnosis not present

## 2023-07-26 DIAGNOSIS — G129 Spinal muscular atrophy, unspecified: Secondary | ICD-10-CM | POA: Diagnosis not present

## 2023-07-29 DIAGNOSIS — G129 Spinal muscular atrophy, unspecified: Secondary | ICD-10-CM | POA: Diagnosis not present

## 2023-07-30 DIAGNOSIS — G129 Spinal muscular atrophy, unspecified: Secondary | ICD-10-CM | POA: Diagnosis not present

## 2023-07-31 DIAGNOSIS — G129 Spinal muscular atrophy, unspecified: Secondary | ICD-10-CM | POA: Diagnosis not present

## 2023-08-01 DIAGNOSIS — G8250 Quadriplegia, unspecified: Secondary | ICD-10-CM | POA: Diagnosis not present

## 2023-08-01 DIAGNOSIS — G129 Spinal muscular atrophy, unspecified: Secondary | ICD-10-CM | POA: Diagnosis not present

## 2023-08-02 DIAGNOSIS — G129 Spinal muscular atrophy, unspecified: Secondary | ICD-10-CM | POA: Diagnosis not present

## 2023-08-05 DIAGNOSIS — G129 Spinal muscular atrophy, unspecified: Secondary | ICD-10-CM | POA: Diagnosis not present

## 2023-08-06 ENCOUNTER — Ambulatory Visit: Payer: Self-pay

## 2023-08-06 DIAGNOSIS — G129 Spinal muscular atrophy, unspecified: Secondary | ICD-10-CM | POA: Diagnosis not present

## 2023-08-06 NOTE — Telephone Encounter (Signed)
Patient called, left VM to return the call to the office to speak to the NT.    Summary: knot on testicle   Pt called saying he found a knot on his testicle last night.  There is nothing at [facility] until November.  CB#  [phone]

## 2023-08-06 NOTE — Telephone Encounter (Signed)
  Chief Complaint: Testicle "Knot" Symptoms: Pea sized knot noted in right testicle last night. Mildly tender to touch Frequency: Last night Pertinent Negatives: Patient denies swelling, redness, warmth, dysuria, retention Disposition: [] ED /[] Urgent Care (no appt availability in office) / [] Appointment(In office/virtual)/ []  [facility]/ [] Home Care/ [x] Refused Recommended Disposition /[] [facility]/ []  Follow-up with PCP Additional Notes: Advised mobile clinic as no availability at practice, declines. Requesting first available appt, secured for 08/28/23., place on wait list. Advised any worsening symptoms go to ED/UC.  Pt verbalizes understanding. Reason for Disposition  All other penis - scrotum symptoms  (Exception: Painless rash < 24 hours duration.)  Answer Assessment - Initial Assessment Questions 1. SYMPTOM: "What's the main symptom you're concerned about?" (e.g., discharge from penis, rash, pain, itching, swelling)     No 2. LOCATION: "Where is the  located?"     Right testicle.Size of green pea 3. ONSET: "When did   start?"     Last night 4. PAIN: "Is there any pain?" If Yes, ask: "How bad is it?"  (Scale 1-10; or mild, moderate, severe)     Mild 5. URINE: "Any difficulty passing urine?" If Yes, ask: "When was the last time?"     No 6. CAUSE: "What do you think is causing the symptoms?"     Unsure 7. OTHER SYMPTOMS: "Do you have any other symptoms?" (e.g., fever, abdomen pain, blood in urine)     No  Protocols used: Penis and Scrotum Symptoms-A-AH

## 2023-08-07 DIAGNOSIS — G129 Spinal muscular atrophy, unspecified: Secondary | ICD-10-CM | POA: Diagnosis not present

## 2023-08-08 DIAGNOSIS — G129 Spinal muscular atrophy, unspecified: Secondary | ICD-10-CM | POA: Diagnosis not present

## 2023-08-09 DIAGNOSIS — G129 Spinal muscular atrophy, unspecified: Secondary | ICD-10-CM | POA: Diagnosis not present

## 2023-08-12 DIAGNOSIS — G129 Spinal muscular atrophy, unspecified: Secondary | ICD-10-CM | POA: Diagnosis not present

## 2023-08-13 DIAGNOSIS — G129 Spinal muscular atrophy, unspecified: Secondary | ICD-10-CM | POA: Diagnosis not present

## 2023-08-14 DIAGNOSIS — G129 Spinal muscular atrophy, unspecified: Secondary | ICD-10-CM | POA: Diagnosis not present

## 2023-08-15 DIAGNOSIS — G129 Spinal muscular atrophy, unspecified: Secondary | ICD-10-CM | POA: Diagnosis not present

## 2023-08-16 DIAGNOSIS — G129 Spinal muscular atrophy, unspecified: Secondary | ICD-10-CM | POA: Diagnosis not present

## 2023-08-17 ENCOUNTER — Ambulatory Visit (HOSPITAL_COMMUNITY)
Admission: EM | Admit: 2023-08-17 | Discharge: 2023-08-17 | Disposition: A | Discharge status: Home / Self Care | Payer: 59

## 2023-08-17 ENCOUNTER — Encounter (HOSPITAL_COMMUNITY): Payer: Self-pay

## 2023-08-17 ENCOUNTER — Ambulatory Visit (INDEPENDENT_AMBULATORY_CARE_PROVIDER_SITE_OTHER): Payer: 59

## 2023-08-17 DIAGNOSIS — S9031XA Contusion of right foot, initial encounter: Secondary | ICD-10-CM

## 2023-08-17 DIAGNOSIS — S99921A Unspecified injury of right foot, initial encounter: Secondary | ICD-10-CM

## 2023-08-17 NOTE — Discharge Instructions (Addendum)
Your x-rays did not show any obvious fracture, the radiology read will be back later and I will call you tomorrow if there is any acute fractures or deformities.  You can use the postop shoe to provide foot protection.  Please elevate as tolerated and alternate between Tylenol and ibuprofen for pain and inflammation.  You can ice the area 20 minutes at a time to help with swelling.  Please follow-up with the orthopedic if your pain and swelling persist beyond the next week.  Return to clinic for any new or urgent symptoms.

## 2023-08-17 NOTE — ED Provider Notes (Signed)
[facility]    CSN: [id number] Arrival date & time: 08/17/23  1645      History   Chief Complaint Chief Complaint  Patient presents with   Foot Injury    HPI Wayne Nichols is a 32 y.o. male.   Patient presents to clinic complaining of right foot pain and swelling for the past week.  He is in a motorized wheelchair and hit his foot on the bed frame.  He has been taken Tylenol and ibuprofen with little relief.  Thought it would get better, but it is not really improved.  There is some bruising to his mid foot.  He is wheelchair dependent.  No previous injuries to this foot.  The history is provided by the patient and medical records.  Foot Injury   Past Medical History:  Diagnosis Date   Complication of anesthesia    Patient has issues opening his mouth wide and taking deep breaths. Hx of spinal muscular atrophy. Noctural desaturations 2021 sleep study, non-invasive ventilation at night recommended.   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   Patient uses Trilogy Ventilator at home   Hemorrhoids    external   Seasonal allergies    Spinal muscular atrophy [facility])    Wheelchair dependent     Patient Active Problem List   Diagnosis Date Noted   Seasonal allergies 04/17/2017   Acne 05/01/2016   Anxiety and depression 05/01/2016   Muscular atrophy, spinal (HCC) 03/01/2015   Spinal muscular atrophy (HCC) 10/08/2011    Past Surgical History:  Procedure Laterality Date   BACK SURGERY     HEMORRHOID SURGERY N/A 11/24/2021   Procedure: EXTERNAL HEMORRHOIDECTOMY;  Surgeon: Luretha Murphy, MD;  Location: [facility] OR;  Service: General;  Laterality: N/A;   MUSCLE BIOPSY     left leg   TENDON RELEASE Bilateral    WISDOM TOOTH EXTRACTION     extracted 3 teeth       Home Medications    Prior to Admission medications   Medication Sig Start Date End Date Taking? Authorizing Provider  clindamycin (CLEOCIN T) 1 % external solution Apply 1 application topically  2 (two) times daily as needed (acne).   Yes [provider]  dapsone 25 MG tablet Take 1 tablet by mouth daily. 06/04/23 12/01/23 Yes [provider]  ibuprofen (ADVIL,MOTRIN) 200 MG tablet Take 200-400 mg by mouth every 6 (six) hours as needed for moderate pain (pain score 4-6).   Yes [provider]  ketoconazole (NIZORAL) 2 % cream Apply 1 application topically daily as needed for irritation.   Yes [provider]  loratadine (CLARITIN) 10 MG tablet Take 1 tablet (10 mg total) by mouth daily. 11/06/22  Yes Newlin, Odette Horns, MD  Risdiplam (EVRYSDI) 0.75 MG/ML SOLR Take 4.95 mg by mouth daily. 6.6 mL 09/23/19  Yes [provider]  tretinoin (RETIN-A) 0.025 % cream Apply a small amount (pea size) twice a week , at night time to affected areas 12/17/22  Yes [provider]  albuterol (VENTOLIN HFA) 108 (90 Base) MCG/ACT inhaler Inhale 2 puffs into the lungs every 6 (six) hours as needed for wheezing. 10/28/20   Hoy Register, MD  chlorpheniramine-HYDROcodone (TUSSIONEX PENNKINETIC ER) 10-8 MG/5ML Take 5 mLs by mouth every 12 (twelve) hours as needed for cough. Patient not taking: Reported on 02/20/2023 01/08/22   Hoy Register, MD  glycopyrrolate (ROBINUL) 1 MG tablet Take 1 mg by mouth at bedtime. Patient not taking: Reported on 02/20/2023 11/10/21  [provider]  predniSONE (DELTASONE) 20 MG tablet Take 1 tablet (20 mg total) by mouth daily with breakfast. Patient not taking: Reported on 02/20/2023 01/08/22   Hoy Register, MD  SODIUM FLUORIDE, DENTAL RINSE, 0.2 % SOLN Take by mouth as directed. 11/06/22   [provider]    Family History Family History  Problem Relation Age of Onset   Stroke Maternal Grandmother    Heart disease Maternal Grandmother     Social History Social History   Tobacco Use   Smoking status: Never   Smokeless tobacco: Never  Vaping Use   Vaping status: Never Used  Substance Use Topics   Alcohol  use: Yes    Comment: Occasional Beer   Drug use: No     Allergies   Amoxicillin and Penicillins   Review of Systems Review of Systems  Per HPI   Physical Exam Triage Vital Signs ED Triage Vitals  Encounter Vitals Group     BP --      Systolic BP Percentile --      Diastolic BP Percentile --      Pulse Rate 08/17/23 1754 83     Resp 08/17/23 1754 16     Temp 08/17/23 1754 98.1 F (36.7 C)     Temp Source 08/17/23 1754 Oral     SpO2 08/17/23 1754 92 %     Weight 08/17/23 1754 125 lb (56.7 kg)     Height 08/17/23 1754 5\' 5"  (1.651 m)     Head Circumference --      Peak Flow --      Pain Score 08/17/23 1752 5     Pain Loc --      Pain Education --      Exclude from Growth Chart --    No data found.  Updated Vital Signs Pulse 83   Temp 98.1 F (36.7 C) (Oral)   Resp 16   Ht 5\' 5"  (1.651 m)   Wt 125 lb (56.7 kg)   SpO2 92%   BMI 20.80 kg/m   Visual Acuity Right Eye Distance:   Left Eye Distance:   Bilateral Distance:    Right Eye Near:   Left Eye Near:    Bilateral Near:     Physical Exam Vitals and nursing note reviewed.  Constitutional:      Appearance: Normal appearance.  HENT:     Head: Normocephalic and atraumatic.     Right Ear: External ear normal.     Left Ear: External ear normal.     Nose: Nose normal.     Mouth/Throat:     Mouth: Mucous membranes are moist.  Eyes:     Conjunctiva/sclera: Conjunctivae normal.  Cardiovascular:     Rate and Rhythm: Normal rate.     Pulses:          Dorsalis pedis pulses are 2+ on the left side.       Posterior tibial pulses are 2+ on the left side.  Pulmonary:     Effort: Pulmonary effort is normal. No respiratory distress.  Musculoskeletal:        General: Swelling, tenderness and signs of injury present. Normal range of motion.       Feet:  Feet:     Comments: Patient with some bruising over area of pain.  Range of motion of toes intact.  Diffuse swelling to the foot noted.  No obvious  deformity. Skin:    General: Skin is warm and dry.  Capillary Refill: Capillary refill takes less than 2 seconds.  Neurological:     General: No focal deficit present.     Mental Status: He is alert and oriented to person, place, and time.  Psychiatric:        Mood and Affect: Mood normal.        Behavior: Behavior normal. Behavior is cooperative.      UC Treatments / Results  Labs (all labs ordered are listed, but only abnormal results are displayed) Labs Reviewed - No data to display  EKG   Radiology No results found.  Procedures Procedures (including critical care time)  Medications Ordered in UC Medications - No data to display  Initial Impression / Assessment and Plan / UC Course  I have reviewed the triage vital signs and the nursing notes.  Pertinent labs & imaging results that were available during my care of the patient were reviewed by me and considered in my medical decision making (see chart for details).  Vitals and triage reviewed, patient is hemodynamically stable.  Wheelchair-bound.  Right foot with edema and some bruising.  Capillary refill brisk, pedal pulses are 2+ as well.  He does not bear any weight, even with transfers.  Imaging does not show any acute deformity by my read, awaiting official radiology overread.  Postop shoe provided for comfort.  Symptomatic management for pain and swelling discussed.  Orthopedic follow-up if pain persist.  Plan of care, follow-up care return precautions given, no questions at this time.     Final Clinical Impressions(s) / UC Diagnoses   Final diagnoses:  Contusion of right foot, initial encounter  Injury of right foot, initial encounter     Discharge Instructions      Your x-rays did not show any obvious fracture, the radiology read will be back later and I will call you tomorrow if there is any acute fractures or deformities.  You can use the postop shoe to provide foot protection.  Please elevate as  tolerated and alternate between Tylenol and ibuprofen for pain and inflammation.  You can ice the area 20 minutes at a time to help with swelling.  Please follow-up with the orthopedic if your pain and swelling persist beyond the next week.  Return to clinic for any new or urgent symptoms.      ED Prescriptions   None    PDMP not reviewed this encounter.   Aleyda Gindlesperger, [place], [place] 08/17/23 [phone]

## 2023-08-17 NOTE — ED Triage Notes (Signed)
Swelling in the rt foot for a week. Patient hit his foot on the bed frame. Onset this last week.   Patient tried ibuprofen and tylenol with little relief.

## 2023-08-19 DIAGNOSIS — G129 Spinal muscular atrophy, unspecified: Secondary | ICD-10-CM | POA: Diagnosis not present

## 2023-08-20 DIAGNOSIS — G129 Spinal muscular atrophy, unspecified: Secondary | ICD-10-CM | POA: Diagnosis not present

## 2023-08-21 DIAGNOSIS — G129 Spinal muscular atrophy, unspecified: Secondary | ICD-10-CM | POA: Diagnosis not present

## 2023-08-22 DIAGNOSIS — G129 Spinal muscular atrophy, unspecified: Secondary | ICD-10-CM | POA: Diagnosis not present

## 2023-08-23 DIAGNOSIS — G129 Spinal muscular atrophy, unspecified: Secondary | ICD-10-CM | POA: Diagnosis not present

## 2023-08-26 DIAGNOSIS — G129 Spinal muscular atrophy, unspecified: Secondary | ICD-10-CM | POA: Diagnosis not present

## 2023-08-27 DIAGNOSIS — G129 Spinal muscular atrophy, unspecified: Secondary | ICD-10-CM | POA: Diagnosis not present

## 2023-08-28 ENCOUNTER — Ambulatory Visit: Payer: 59 | Admitting: Physician Assistant

## 2023-08-28 DIAGNOSIS — G129 Spinal muscular atrophy, unspecified: Secondary | ICD-10-CM | POA: Diagnosis not present

## 2023-08-29 DIAGNOSIS — G129 Spinal muscular atrophy, unspecified: Secondary | ICD-10-CM | POA: Diagnosis not present

## 2023-08-30 DIAGNOSIS — G129 Spinal muscular atrophy, unspecified: Secondary | ICD-10-CM | POA: Diagnosis not present

## 2023-09-01 DIAGNOSIS — G8250 Quadriplegia, unspecified: Secondary | ICD-10-CM | POA: Diagnosis not present

## 2023-09-01 DIAGNOSIS — G129 Spinal muscular atrophy, unspecified: Secondary | ICD-10-CM | POA: Diagnosis not present

## 2023-09-02 DIAGNOSIS — G129 Spinal muscular atrophy, unspecified: Secondary | ICD-10-CM | POA: Diagnosis not present

## 2023-09-03 DIAGNOSIS — G129 Spinal muscular atrophy, unspecified: Secondary | ICD-10-CM | POA: Diagnosis not present

## 2023-09-04 DIAGNOSIS — G129 Spinal muscular atrophy, unspecified: Secondary | ICD-10-CM | POA: Diagnosis not present

## 2023-09-05 DIAGNOSIS — G129 Spinal muscular atrophy, unspecified: Secondary | ICD-10-CM | POA: Diagnosis not present

## 2023-09-06 DIAGNOSIS — G129 Spinal muscular atrophy, unspecified: Secondary | ICD-10-CM | POA: Diagnosis not present

## 2023-09-09 DIAGNOSIS — G129 Spinal muscular atrophy, unspecified: Secondary | ICD-10-CM | POA: Diagnosis not present

## 2023-09-10 DIAGNOSIS — G129 Spinal muscular atrophy, unspecified: Secondary | ICD-10-CM | POA: Diagnosis not present

## 2023-09-11 DIAGNOSIS — G129 Spinal muscular atrophy, unspecified: Secondary | ICD-10-CM | POA: Diagnosis not present

## 2023-09-12 DIAGNOSIS — G129 Spinal muscular atrophy, unspecified: Secondary | ICD-10-CM | POA: Diagnosis not present

## 2023-09-13 DIAGNOSIS — G129 Spinal muscular atrophy, unspecified: Secondary | ICD-10-CM | POA: Diagnosis not present

## 2023-09-16 DIAGNOSIS — G129 Spinal muscular atrophy, unspecified: Secondary | ICD-10-CM | POA: Diagnosis not present

## 2023-09-17 DIAGNOSIS — G129 Spinal muscular atrophy, unspecified: Secondary | ICD-10-CM | POA: Diagnosis not present

## 2023-09-18 DIAGNOSIS — G129 Spinal muscular atrophy, unspecified: Secondary | ICD-10-CM | POA: Diagnosis not present

## 2023-09-19 DIAGNOSIS — G129 Spinal muscular atrophy, unspecified: Secondary | ICD-10-CM | POA: Diagnosis not present

## 2023-09-20 DIAGNOSIS — G129 Spinal muscular atrophy, unspecified: Secondary | ICD-10-CM | POA: Diagnosis not present

## 2023-09-23 DIAGNOSIS — G129 Spinal muscular atrophy, unspecified: Secondary | ICD-10-CM | POA: Diagnosis not present

## 2023-09-24 DIAGNOSIS — G129 Spinal muscular atrophy, unspecified: Secondary | ICD-10-CM | POA: Diagnosis not present

## 2023-09-25 DIAGNOSIS — G129 Spinal muscular atrophy, unspecified: Secondary | ICD-10-CM | POA: Diagnosis not present

## 2023-09-26 DIAGNOSIS — G129 Spinal muscular atrophy, unspecified: Secondary | ICD-10-CM | POA: Diagnosis not present

## 2023-09-27 DIAGNOSIS — G129 Spinal muscular atrophy, unspecified: Secondary | ICD-10-CM | POA: Diagnosis not present

## 2023-09-30 DIAGNOSIS — G129 Spinal muscular atrophy, unspecified: Secondary | ICD-10-CM | POA: Diagnosis not present

## 2023-10-01 DIAGNOSIS — G129 Spinal muscular atrophy, unspecified: Secondary | ICD-10-CM | POA: Diagnosis not present

## 2023-10-02 DIAGNOSIS — G129 Spinal muscular atrophy, unspecified: Secondary | ICD-10-CM | POA: Diagnosis not present

## 2023-10-03 DIAGNOSIS — G129 Spinal muscular atrophy, unspecified: Secondary | ICD-10-CM | POA: Diagnosis not present

## 2023-10-04 DIAGNOSIS — G129 Spinal muscular atrophy, unspecified: Secondary | ICD-10-CM | POA: Diagnosis not present

## 2023-10-07 DIAGNOSIS — G129 Spinal muscular atrophy, unspecified: Secondary | ICD-10-CM | POA: Diagnosis not present

## 2023-10-08 DIAGNOSIS — G129 Spinal muscular atrophy, unspecified: Secondary | ICD-10-CM | POA: Diagnosis not present

## 2023-10-09 DIAGNOSIS — G129 Spinal muscular atrophy, unspecified: Secondary | ICD-10-CM | POA: Diagnosis not present

## 2023-10-10 DIAGNOSIS — G129 Spinal muscular atrophy, unspecified: Secondary | ICD-10-CM | POA: Diagnosis not present

## 2023-10-11 DIAGNOSIS — G129 Spinal muscular atrophy, unspecified: Secondary | ICD-10-CM | POA: Diagnosis not present

## 2023-10-14 DIAGNOSIS — G129 Spinal muscular atrophy, unspecified: Secondary | ICD-10-CM | POA: Diagnosis not present

## 2023-10-15 DIAGNOSIS — G129 Spinal muscular atrophy, unspecified: Secondary | ICD-10-CM | POA: Diagnosis not present

## 2023-10-16 DIAGNOSIS — G129 Spinal muscular atrophy, unspecified: Secondary | ICD-10-CM | POA: Diagnosis not present

## 2023-10-17 DIAGNOSIS — G129 Spinal muscular atrophy, unspecified: Secondary | ICD-10-CM | POA: Diagnosis not present

## 2023-10-18 DIAGNOSIS — G129 Spinal muscular atrophy, unspecified: Secondary | ICD-10-CM | POA: Diagnosis not present

## 2023-10-21 DIAGNOSIS — G129 Spinal muscular atrophy, unspecified: Secondary | ICD-10-CM | POA: Diagnosis not present

## 2023-10-22 DIAGNOSIS — G129 Spinal muscular atrophy, unspecified: Secondary | ICD-10-CM | POA: Diagnosis not present

## 2023-10-23 DIAGNOSIS — G129 Spinal muscular atrophy, unspecified: Secondary | ICD-10-CM | POA: Diagnosis not present

## 2023-10-24 DIAGNOSIS — G129 Spinal muscular atrophy, unspecified: Secondary | ICD-10-CM | POA: Diagnosis not present

## 2023-10-25 DIAGNOSIS — G129 Spinal muscular atrophy, unspecified: Secondary | ICD-10-CM | POA: Diagnosis not present

## 2023-10-28 DIAGNOSIS — G129 Spinal muscular atrophy, unspecified: Secondary | ICD-10-CM | POA: Diagnosis not present

## 2023-10-29 DIAGNOSIS — G129 Spinal muscular atrophy, unspecified: Secondary | ICD-10-CM | POA: Diagnosis not present

## 2023-10-30 DIAGNOSIS — G129 Spinal muscular atrophy, unspecified: Secondary | ICD-10-CM | POA: Diagnosis not present

## 2023-10-31 DIAGNOSIS — G129 Spinal muscular atrophy, unspecified: Secondary | ICD-10-CM | POA: Diagnosis not present

## 2023-11-01 DIAGNOSIS — G129 Spinal muscular atrophy, unspecified: Secondary | ICD-10-CM | POA: Diagnosis not present

## 2023-11-01 DIAGNOSIS — G8250 Quadriplegia, unspecified: Secondary | ICD-10-CM | POA: Diagnosis not present

## 2023-11-04 DIAGNOSIS — G129 Spinal muscular atrophy, unspecified: Secondary | ICD-10-CM | POA: Diagnosis not present

## 2023-11-05 DIAGNOSIS — G129 Spinal muscular atrophy, unspecified: Secondary | ICD-10-CM | POA: Diagnosis not present

## 2023-11-06 DIAGNOSIS — G129 Spinal muscular atrophy, unspecified: Secondary | ICD-10-CM | POA: Diagnosis not present

## 2023-11-07 DIAGNOSIS — G129 Spinal muscular atrophy, unspecified: Secondary | ICD-10-CM | POA: Diagnosis not present

## 2023-11-08 DIAGNOSIS — G129 Spinal muscular atrophy, unspecified: Secondary | ICD-10-CM | POA: Diagnosis not present

## 2023-11-11 DIAGNOSIS — G129 Spinal muscular atrophy, unspecified: Secondary | ICD-10-CM | POA: Diagnosis not present

## 2023-11-12 DIAGNOSIS — G129 Spinal muscular atrophy, unspecified: Secondary | ICD-10-CM | POA: Diagnosis not present

## 2023-11-13 DIAGNOSIS — G129 Spinal muscular atrophy, unspecified: Secondary | ICD-10-CM | POA: Diagnosis not present

## 2023-11-14 DIAGNOSIS — G129 Spinal muscular atrophy, unspecified: Secondary | ICD-10-CM | POA: Diagnosis not present

## 2023-11-15 DIAGNOSIS — G129 Spinal muscular atrophy, unspecified: Secondary | ICD-10-CM | POA: Diagnosis not present

## 2023-11-18 DIAGNOSIS — G129 Spinal muscular atrophy, unspecified: Secondary | ICD-10-CM | POA: Diagnosis not present

## 2023-11-19 DIAGNOSIS — G129 Spinal muscular atrophy, unspecified: Secondary | ICD-10-CM | POA: Diagnosis not present

## 2023-11-20 DIAGNOSIS — G129 Spinal muscular atrophy, unspecified: Secondary | ICD-10-CM | POA: Diagnosis not present

## 2023-11-21 DIAGNOSIS — G129 Spinal muscular atrophy, unspecified: Secondary | ICD-10-CM | POA: Diagnosis not present

## 2023-11-22 DIAGNOSIS — G129 Spinal muscular atrophy, unspecified: Secondary | ICD-10-CM | POA: Diagnosis not present

## 2023-11-25 DIAGNOSIS — G129 Spinal muscular atrophy, unspecified: Secondary | ICD-10-CM | POA: Diagnosis not present

## 2023-11-26 DIAGNOSIS — G129 Spinal muscular atrophy, unspecified: Secondary | ICD-10-CM | POA: Diagnosis not present

## 2023-11-27 DIAGNOSIS — G129 Spinal muscular atrophy, unspecified: Secondary | ICD-10-CM | POA: Diagnosis not present

## 2023-11-28 DIAGNOSIS — G129 Spinal muscular atrophy, unspecified: Secondary | ICD-10-CM | POA: Diagnosis not present

## 2023-11-29 DIAGNOSIS — G129 Spinal muscular atrophy, unspecified: Secondary | ICD-10-CM | POA: Diagnosis not present

## 2023-12-02 DIAGNOSIS — G8250 Quadriplegia, unspecified: Secondary | ICD-10-CM | POA: Diagnosis not present

## 2023-12-02 DIAGNOSIS — G129 Spinal muscular atrophy, unspecified: Secondary | ICD-10-CM | POA: Diagnosis not present

## 2023-12-03 DIAGNOSIS — G129 Spinal muscular atrophy, unspecified: Secondary | ICD-10-CM | POA: Diagnosis not present

## 2023-12-04 DIAGNOSIS — G129 Spinal muscular atrophy, unspecified: Secondary | ICD-10-CM | POA: Diagnosis not present

## 2023-12-05 DIAGNOSIS — G129 Spinal muscular atrophy, unspecified: Secondary | ICD-10-CM | POA: Diagnosis not present

## 2023-12-06 DIAGNOSIS — G129 Spinal muscular atrophy, unspecified: Secondary | ICD-10-CM | POA: Diagnosis not present

## 2023-12-07 DIAGNOSIS — G129 Spinal muscular atrophy, unspecified: Secondary | ICD-10-CM | POA: Diagnosis not present

## 2023-12-09 DIAGNOSIS — G129 Spinal muscular atrophy, unspecified: Secondary | ICD-10-CM | POA: Diagnosis not present

## 2023-12-10 DIAGNOSIS — G129 Spinal muscular atrophy, unspecified: Secondary | ICD-10-CM | POA: Diagnosis not present

## 2023-12-11 DIAGNOSIS — G129 Spinal muscular atrophy, unspecified: Secondary | ICD-10-CM | POA: Diagnosis not present

## 2023-12-12 DIAGNOSIS — G129 Spinal muscular atrophy, unspecified: Secondary | ICD-10-CM | POA: Diagnosis not present

## 2023-12-13 ENCOUNTER — Encounter: Payer: Self-pay | Admitting: Family Medicine

## 2023-12-13 DIAGNOSIS — G129 Spinal muscular atrophy, unspecified: Secondary | ICD-10-CM | POA: Diagnosis not present

## 2023-12-16 DIAGNOSIS — G129 Spinal muscular atrophy, unspecified: Secondary | ICD-10-CM | POA: Diagnosis not present

## 2023-12-17 DIAGNOSIS — G129 Spinal muscular atrophy, unspecified: Secondary | ICD-10-CM | POA: Diagnosis not present

## 2023-12-18 DIAGNOSIS — G129 Spinal muscular atrophy, unspecified: Secondary | ICD-10-CM | POA: Diagnosis not present

## 2023-12-19 DIAGNOSIS — G129 Spinal muscular atrophy, unspecified: Secondary | ICD-10-CM | POA: Diagnosis not present

## 2023-12-20 DIAGNOSIS — G129 Spinal muscular atrophy, unspecified: Secondary | ICD-10-CM | POA: Diagnosis not present

## 2023-12-23 DIAGNOSIS — G129 Spinal muscular atrophy, unspecified: Secondary | ICD-10-CM | POA: Diagnosis not present

## 2023-12-24 DIAGNOSIS — G129 Spinal muscular atrophy, unspecified: Secondary | ICD-10-CM | POA: Diagnosis not present

## 2023-12-25 DIAGNOSIS — G129 Spinal muscular atrophy, unspecified: Secondary | ICD-10-CM | POA: Diagnosis not present

## 2023-12-26 DIAGNOSIS — G129 Spinal muscular atrophy, unspecified: Secondary | ICD-10-CM | POA: Diagnosis not present

## 2023-12-27 DIAGNOSIS — G129 Spinal muscular atrophy, unspecified: Secondary | ICD-10-CM | POA: Diagnosis not present

## 2023-12-30 DIAGNOSIS — G129 Spinal muscular atrophy, unspecified: Secondary | ICD-10-CM | POA: Diagnosis not present

## 2023-12-31 DIAGNOSIS — G129 Spinal muscular atrophy, unspecified: Secondary | ICD-10-CM | POA: Diagnosis not present

## 2024-01-01 DIAGNOSIS — G129 Spinal muscular atrophy, unspecified: Secondary | ICD-10-CM | POA: Diagnosis not present

## 2024-01-02 DIAGNOSIS — G129 Spinal muscular atrophy, unspecified: Secondary | ICD-10-CM | POA: Diagnosis not present

## 2024-01-03 DIAGNOSIS — G129 Spinal muscular atrophy, unspecified: Secondary | ICD-10-CM | POA: Diagnosis not present

## 2024-01-04 DIAGNOSIS — G129 Spinal muscular atrophy, unspecified: Secondary | ICD-10-CM | POA: Diagnosis not present

## 2024-01-06 DIAGNOSIS — G129 Spinal muscular atrophy, unspecified: Secondary | ICD-10-CM | POA: Diagnosis not present

## 2024-01-07 DIAGNOSIS — G129 Spinal muscular atrophy, unspecified: Secondary | ICD-10-CM | POA: Diagnosis not present

## 2024-01-08 DIAGNOSIS — G129 Spinal muscular atrophy, unspecified: Secondary | ICD-10-CM | POA: Diagnosis not present

## 2024-01-09 DIAGNOSIS — G129 Spinal muscular atrophy, unspecified: Secondary | ICD-10-CM | POA: Diagnosis not present

## 2024-01-10 DIAGNOSIS — G129 Spinal muscular atrophy, unspecified: Secondary | ICD-10-CM | POA: Diagnosis not present

## 2024-01-13 DIAGNOSIS — G129 Spinal muscular atrophy, unspecified: Secondary | ICD-10-CM | POA: Diagnosis not present

## 2024-01-14 DIAGNOSIS — G129 Spinal muscular atrophy, unspecified: Secondary | ICD-10-CM | POA: Diagnosis not present

## 2024-01-15 DIAGNOSIS — G129 Spinal muscular atrophy, unspecified: Secondary | ICD-10-CM | POA: Diagnosis not present

## 2024-01-16 DIAGNOSIS — G129 Spinal muscular atrophy, unspecified: Secondary | ICD-10-CM | POA: Diagnosis not present

## 2024-01-17 DIAGNOSIS — G129 Spinal muscular atrophy, unspecified: Secondary | ICD-10-CM | POA: Diagnosis not present

## 2024-01-20 DIAGNOSIS — G129 Spinal muscular atrophy, unspecified: Secondary | ICD-10-CM | POA: Diagnosis not present

## 2024-01-21 DIAGNOSIS — G129 Spinal muscular atrophy, unspecified: Secondary | ICD-10-CM | POA: Diagnosis not present

## 2024-01-22 DIAGNOSIS — G129 Spinal muscular atrophy, unspecified: Secondary | ICD-10-CM | POA: Diagnosis not present

## 2024-01-23 DIAGNOSIS — G129 Spinal muscular atrophy, unspecified: Secondary | ICD-10-CM | POA: Diagnosis not present

## 2024-01-24 DIAGNOSIS — G129 Spinal muscular atrophy, unspecified: Secondary | ICD-10-CM | POA: Diagnosis not present

## 2024-01-27 DIAGNOSIS — G129 Spinal muscular atrophy, unspecified: Secondary | ICD-10-CM | POA: Diagnosis not present

## 2024-01-28 DIAGNOSIS — G129 Spinal muscular atrophy, unspecified: Secondary | ICD-10-CM | POA: Diagnosis not present

## 2024-01-29 DIAGNOSIS — G129 Spinal muscular atrophy, unspecified: Secondary | ICD-10-CM | POA: Diagnosis not present

## 2024-01-30 DIAGNOSIS — G129 Spinal muscular atrophy, unspecified: Secondary | ICD-10-CM | POA: Diagnosis not present

## 2024-01-31 DIAGNOSIS — G129 Spinal muscular atrophy, unspecified: Secondary | ICD-10-CM | POA: Diagnosis not present

## 2024-02-03 DIAGNOSIS — G129 Spinal muscular atrophy, unspecified: Secondary | ICD-10-CM | POA: Diagnosis not present

## 2024-02-04 DIAGNOSIS — G129 Spinal muscular atrophy, unspecified: Secondary | ICD-10-CM | POA: Diagnosis not present

## 2024-02-05 DIAGNOSIS — G129 Spinal muscular atrophy, unspecified: Secondary | ICD-10-CM | POA: Diagnosis not present

## 2024-02-06 DIAGNOSIS — G129 Spinal muscular atrophy, unspecified: Secondary | ICD-10-CM | POA: Diagnosis not present

## 2024-02-07 DIAGNOSIS — G129 Spinal muscular atrophy, unspecified: Secondary | ICD-10-CM | POA: Diagnosis not present

## 2024-02-10 DIAGNOSIS — G129 Spinal muscular atrophy, unspecified: Secondary | ICD-10-CM | POA: Diagnosis not present

## 2024-02-11 DIAGNOSIS — G129 Spinal muscular atrophy, unspecified: Secondary | ICD-10-CM | POA: Diagnosis not present

## 2024-02-12 ENCOUNTER — Telehealth: Payer: Self-pay

## 2024-02-12 DIAGNOSIS — G129 Spinal muscular atrophy, unspecified: Secondary | ICD-10-CM | POA: Diagnosis not present

## 2024-02-13 ENCOUNTER — Telehealth: Payer: Self-pay | Admitting: *Deleted

## 2024-02-13 DIAGNOSIS — G129 Spinal muscular atrophy, unspecified: Secondary | ICD-10-CM | POA: Diagnosis not present

## 2024-02-13 NOTE — Progress Notes (Signed)
 Complex Care Management Note Care Guide Note  02/13/2024 Name: Wayne Nichols MRN: [id number] DOB: [birth year]   Complex Care Management Outreach Attempts: An unsuccessful telephone outreach was attempted today to offer the patient information about available complex care management services.  Follow Up Plan:  Additional outreach attempts will be made to offer the patient complex care management information and services.   Encounter Outcome:  No Answer  Barnie Bora  [facility]  [facility], [facility] Guide  Direct Dial: [phone]  Fax [phone]

## 2024-02-13 NOTE — Progress Notes (Signed)
 Complex Care Management Note  Care Guide Note 02/13/2024 Name: Wayne Nichols MRN: [id number] DOB: [birth year]  Wayne Nichols is a 33 y.o. year old male who sees Joaquin Mulberry, MD for primary care. I reached out to Wayne Nichols by phone today to offer complex care management services.  Wayne Nichols was given information about Complex Care Management services today including:   The Complex Care Management services include support from the care team which includes your Nurse Care Manager, Clinical Social Worker, or Pharmacist.  The Complex Care Management team is here to help remove barriers to the health concerns and goals most important to you. Complex Care Management services are voluntary, and the patient may decline or stop services at any time by request to their care team member.   Complex Care Management Consent Status: Patient did not agree to participate in complex care management services at this time.  Follow up plan: None  Encounter Outcome:  Patient Refused  Barnie Bora  [facility]  [facility], [facility] Guide  Direct Dial: [phone]  Fax [phone]

## 2024-02-14 DIAGNOSIS — G129 Spinal muscular atrophy, unspecified: Secondary | ICD-10-CM | POA: Diagnosis not present

## 2024-02-17 DIAGNOSIS — G129 Spinal muscular atrophy, unspecified: Secondary | ICD-10-CM | POA: Diagnosis not present

## 2024-02-18 DIAGNOSIS — G129 Spinal muscular atrophy, unspecified: Secondary | ICD-10-CM | POA: Diagnosis not present

## 2024-02-19 DIAGNOSIS — G129 Spinal muscular atrophy, unspecified: Secondary | ICD-10-CM | POA: Diagnosis not present

## 2024-02-20 DIAGNOSIS — G129 Spinal muscular atrophy, unspecified: Secondary | ICD-10-CM | POA: Diagnosis not present

## 2024-02-21 DIAGNOSIS — G129 Spinal muscular atrophy, unspecified: Secondary | ICD-10-CM | POA: Diagnosis not present

## 2024-02-24 DIAGNOSIS — G129 Spinal muscular atrophy, unspecified: Secondary | ICD-10-CM | POA: Diagnosis not present

## 2024-02-25 DIAGNOSIS — G129 Spinal muscular atrophy, unspecified: Secondary | ICD-10-CM | POA: Diagnosis not present

## 2024-02-26 DIAGNOSIS — G129 Spinal muscular atrophy, unspecified: Secondary | ICD-10-CM | POA: Diagnosis not present

## 2024-02-27 DIAGNOSIS — G129 Spinal muscular atrophy, unspecified: Secondary | ICD-10-CM | POA: Diagnosis not present

## 2024-02-28 DIAGNOSIS — G129 Spinal muscular atrophy, unspecified: Secondary | ICD-10-CM | POA: Diagnosis not present

## 2024-03-02 DIAGNOSIS — G129 Spinal muscular atrophy, unspecified: Secondary | ICD-10-CM | POA: Diagnosis not present

## 2024-03-03 DIAGNOSIS — G129 Spinal muscular atrophy, unspecified: Secondary | ICD-10-CM | POA: Diagnosis not present

## 2024-03-04 DIAGNOSIS — G129 Spinal muscular atrophy, unspecified: Secondary | ICD-10-CM | POA: Diagnosis not present

## 2024-03-05 DIAGNOSIS — G129 Spinal muscular atrophy, unspecified: Secondary | ICD-10-CM | POA: Diagnosis not present

## 2024-03-06 DIAGNOSIS — G129 Spinal muscular atrophy, unspecified: Secondary | ICD-10-CM | POA: Diagnosis not present

## 2024-03-09 DIAGNOSIS — G129 Spinal muscular atrophy, unspecified: Secondary | ICD-10-CM | POA: Diagnosis not present

## 2024-03-10 DIAGNOSIS — G129 Spinal muscular atrophy, unspecified: Secondary | ICD-10-CM | POA: Diagnosis not present

## 2024-03-11 DIAGNOSIS — G129 Spinal muscular atrophy, unspecified: Secondary | ICD-10-CM | POA: Diagnosis not present

## 2024-03-12 DIAGNOSIS — G129 Spinal muscular atrophy, unspecified: Secondary | ICD-10-CM | POA: Diagnosis not present

## 2024-03-13 ENCOUNTER — Telehealth: Payer: Self-pay

## 2024-03-13 DIAGNOSIS — F32A Depression, unspecified: Secondary | ICD-10-CM

## 2024-03-13 DIAGNOSIS — G129 Spinal muscular atrophy, unspecified: Secondary | ICD-10-CM | POA: Diagnosis not present

## 2024-03-16 DIAGNOSIS — G129 Spinal muscular atrophy, unspecified: Secondary | ICD-10-CM | POA: Diagnosis not present

## 2024-03-17 DIAGNOSIS — G129 Spinal muscular atrophy, unspecified: Secondary | ICD-10-CM | POA: Diagnosis not present

## 2024-03-18 DIAGNOSIS — G129 Spinal muscular atrophy, unspecified: Secondary | ICD-10-CM | POA: Diagnosis not present

## 2024-03-19 DIAGNOSIS — G129 Spinal muscular atrophy, unspecified: Secondary | ICD-10-CM | POA: Diagnosis not present

## 2024-03-20 DIAGNOSIS — G129 Spinal muscular atrophy, unspecified: Secondary | ICD-10-CM | POA: Diagnosis not present

## 2024-03-23 DIAGNOSIS — G129 Spinal muscular atrophy, unspecified: Secondary | ICD-10-CM | POA: Diagnosis not present

## 2024-03-24 DIAGNOSIS — G129 Spinal muscular atrophy, unspecified: Secondary | ICD-10-CM | POA: Diagnosis not present

## 2024-03-25 ENCOUNTER — Telehealth: Payer: Self-pay | Admitting: *Deleted

## 2024-03-25 DIAGNOSIS — G129 Spinal muscular atrophy, unspecified: Secondary | ICD-10-CM | POA: Diagnosis not present

## 2024-03-25 NOTE — Progress Notes (Signed)
 Complex Care Management Note  Care Guide Note 03/25/2024 Name: Wayne Nichols MRN: [id number] DOB: Aug 28, 1991  Wayne Nichols is a 33 y.o. year old male who sees Joaquin Mulberry, MD for primary care. I reached out to Wayne Nichols by phone today to offer complex care management services.  [name] was given information about Complex Care Management services today including:   The Complex Care Management services include support from the care team which includes your Nurse Care Manager, Clinical Social Worker, or Pharmacist.  The Complex Care Management team is here to help remove barriers to the health concerns and goals most important to you. Complex Care Management services are voluntary, and the patient may decline or stop services at any time by request to their care team member.   Complex Care Management Consent Status: Patient agreed to services and verbal consent obtained.   Follow up plan:  Telephone appointment with complex care management team member scheduled for:  04/08/24  Encounter Outcome:  Patient Scheduled

## 2024-03-26 DIAGNOSIS — G129 Spinal muscular atrophy, unspecified: Secondary | ICD-10-CM | POA: Diagnosis not present

## 2024-03-27 DIAGNOSIS — G129 Spinal muscular atrophy, unspecified: Secondary | ICD-10-CM | POA: Diagnosis not present

## 2024-03-30 DIAGNOSIS — G129 Spinal muscular atrophy, unspecified: Secondary | ICD-10-CM | POA: Diagnosis not present

## 2024-03-31 DIAGNOSIS — G129 Spinal muscular atrophy, unspecified: Secondary | ICD-10-CM | POA: Diagnosis not present

## 2024-04-01 DIAGNOSIS — G129 Spinal muscular atrophy, unspecified: Secondary | ICD-10-CM | POA: Diagnosis not present

## 2024-04-02 DIAGNOSIS — G129 Spinal muscular atrophy, unspecified: Secondary | ICD-10-CM | POA: Diagnosis not present

## 2024-04-03 DIAGNOSIS — G129 Spinal muscular atrophy, unspecified: Secondary | ICD-10-CM | POA: Diagnosis not present

## 2024-04-06 DIAGNOSIS — G129 Spinal muscular atrophy, unspecified: Secondary | ICD-10-CM | POA: Diagnosis not present

## 2024-04-07 DIAGNOSIS — G129 Spinal muscular atrophy, unspecified: Secondary | ICD-10-CM | POA: Diagnosis not present

## 2024-04-08 ENCOUNTER — Other Ambulatory Visit: Payer: Self-pay | Admitting: Licensed Clinical Social Worker

## 2024-04-08 DIAGNOSIS — G129 Spinal muscular atrophy, unspecified: Secondary | ICD-10-CM | POA: Diagnosis not present

## 2024-04-08 NOTE — Patient Instructions (Signed)
 Visit Information  Mr. Waiters was given information about Medicaid Managed Care team care coordination services as a part of their [facility] Community Plan Medicaid benefit. Mylene Arts verbally consented to engagement with the [facility] Managed Care team.   If you are experiencing a medical emergency, please call 911 or report to your local emergency department or urgent care.   If you have a non-emergency medical problem during routine business hours, please contact your provider's office and ask to speak with a nurse.   For questions related to your [facility], please call: [phone] or visit the homepage here: [email or web address]  If you would like to schedule transportation through your [facility], please call the following number at least 2 days in advance of your appointment: [phone]   Rides for urgent appointments can also be made after hours by calling Member Services.  Call the Behavioral Health Crisis Line at [phone], at any time, 24 hours a day, 7 days a week. If you are in danger or need immediate medical attention call 911.  If you would like help to quit smoking, call 1-800-QUIT-NOW (605-068-2027) OR Espaol: 1-855-Djelo-Ya (7-628-315-1761) o para ms informacin haga clic aqu or Text READY to 607-371 to register via text  Mr. Wayne Nichols - following are the goals we discussed in your visit today:   Goals Addressed             This Visit's Progress    LCSW VBCI Social Work Care Plan   On track    Problems:   Disease Management support and education needs related to Depression: anxiety  CSW Clinical Goal(s):   Over the next 90 days the Patient will attend all scheduled medical appointments as evidenced by patient report and care team review of appointment completion in electronic MEDICAL RECORD NUMBER  demonstrate a reduction in symptoms  related to Depression: anxiety .  Interventions:  Mental Health:  Evaluation of current treatment plan related to Depression: anxiety Active listening / Reflection utilized Financial risk analyst / information provided Discussed referral options to connect for ongoing therapy: Resources discussed and sent via email for review Emotional Support Provided Provided general psycho-education for mental health needs Solution-Focued Strategies employed:Pt reports interest in establishing individual counseling  Patient Goals/Self-Care Activities:  Continue taking your medication as prescribed.   Increase coping skills  Plan:   Telephone follow up appointment with care management team member scheduled for:  2-4 weeks        Please see education materials related to topics discussed provided by MyChart link.  Patient verbalizes understanding of instructions and care plan provided today and agrees to view in MyChart. Active MyChart status and patient understanding of how to access instructions and care plan via MyChart confirmed with patient.     Licensed Clinical Social Worker will f/up on 07/09 at 10 AM  [name], LCSW [place]  [facility], [facility] Clinical Social Worker Direct Dial: [phone]  Fax: [phone] Website: [email or web address] 11:48 AM   Following is a copy of your plan of care:  There are no care plans that you recently modified to display for this patient.

## 2024-04-08 NOTE — Patient Outreach (Signed)
 Complex Care Management   Visit Note  04/08/2024  Name:  Wayne Nichols MRN: [id number] DOB: 1991-04-29  Situation: Referral received for Complex Care Management related to Mental/Behavioral Health diagnosis Depression and Anxiety I obtained verbal consent from Patient.  Visit completed with pt  on the phone  Background:   Past Medical History:  Diagnosis Date   Complication of anesthesia    Patient has issues opening his mouth wide and taking deep breaths. Hx of spinal muscular atrophy. Noctural desaturations 2021 sleep study, non-invasive ventilation at night recommended.   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   Patient uses Trilogy Ventilator at home   Hemorrhoids    external   Seasonal allergies    Spinal muscular atrophy (HCC)    Wheelchair dependent     Assessment: Patient Reported Symptoms:  Cognitive Cognitive Status: Alert and oriented to person, place, and time, Normal speech and language skills Cognitive/Intellectual Conditions Management [RPT]: None reported or documented in medical history or problem list      Neurological Neurological Review of Symptoms: Not assessed    HEENT HEENT Symptoms Reported: Not assessed      Cardiovascular Cardiovascular Symptoms Reported: Not assessed    Respiratory Respiratory Symptoms Reported: Not assesed    Endocrine Patient reports the following symptoms related to hypoglycemia or hyperglycemia : Not assessed    Gastrointestinal Gastrointestinal Symptoms Reported: Not assessed      Genitourinary Genitourinary Symptoms Reported: Not assessed    Integumentary Integumentary Symptoms Reported: Not assessed    Musculoskeletal Musculoskelatal Symptoms Reviewed: Other Other Musculoskeletal Symptoms: Pt has muscular atrophy, spinal Musculoskeletal Conditions: Other Other Musculoskeletal Conditions: Pt has muscular atrophy, spinal Musculoskeletal Management Strategies: Routine screening      Psychosocial  Psychosocial Symptoms Reported: Depression - if selected complete PHQ 2-9, Anxiety - if selected complete GAD Additional Psychological Details: Patient identified triggers to anxiety and depression symptoms. Pt identified healthy coping skills to assist with stress management and is open to individual counseling. LCSW discussed/provided resources. Patient had another appt and declined completing PHQ9/GAD7 Behavioral Health Conditions: Anxiety, Depression Behavioral Management Strategies: Coping strategies, Counseling, Support system   Quality of Family Relationships: helpful, involved, stressful, supportive Do you feel physically threatened by others?: No      02/20/2023    3:32 PM  Depression screen PHQ 2/9  Decreased Interest 0  Down, Depressed, Hopeless 0  PHQ - 2 Score 0  Altered sleeping 3  Tired, decreased energy 1  Change in appetite 0  Feeling bad or failure about yourself  0  Trouble concentrating 0  Moving slowly or fidgety/restless 0  Suicidal thoughts 0  PHQ-9 Score 4    There were no vitals filed for this visit.  Medications Reviewed Today     Reviewed by Pricila Bridge D, LCSW (Social Worker) on 04/08/24 at 1137  Med List Status: <None>   Medication Order Taking? Sig Documenting Provider Last Dose Status Informant  albuterol  (VENTOLIN  HFA) 108 (90 Base) MCG/ACT inhaler [id number]  Inhale 2 puffs into the lungs every 6 (six) hours as needed for wheezing. Newlin, Enobong, MD  Active Self  chlorpheniramine-HYDROcodone  Duwaine Gins [facility]) 10-8 MG/5ML [id number]  Take 5 mLs by mouth every 12 (twelve) hours as needed for cough.  Patient not taking: Reported on 04/08/2024   Newlin, Enobong, MD  Active   clindamycin  (CLEOCIN  T) 1 % external solution [id number] Yes Apply 1 application topically 2 (two) times daily as needed (acne). [provider]  Active Self  Med Note Vivian Groom, MELISSA R   Thu Nov 16, 2021 10:58 AM)    glycopyrrolate (ROBINUL) 1 MG  tablet [id number]  Take 1 mg by mouth at bedtime.  Patient not taking: Reported on 04/08/2024   [provider]  Active Self  ibuprofen (ADVIL,MOTRIN) 200 MG tablet [id number] Yes Take 200-400 mg by mouth every 6 (six) hours as needed for moderate pain (pain score 4-6). [provider]  Active Self  ketoconazole (NIZORAL) 2 % cream [id number] Yes Apply 1 application topically daily as needed for irritation. [provider]  Active Self  loratadine  (CLARITIN ) 10 MG tablet [id number]  Take 1 tablet (10 mg total) by mouth daily.  Patient not taking: Reported on 04/08/2024   Newlin, Enobong, MD  Active   predniSONE  (DELTASONE ) 20 MG tablet [id number]  Take 1 tablet (20 mg total) by mouth daily with breakfast.  Patient not taking: Reported on 04/08/2024   Newlin, Enobong, MD  Active   Risdiplam [facility]) 0.75 MG/ML SOLR [id number] Yes Take 4.95 mg by mouth daily. 6.6 mL [provider]  Active Self  SODIUM FLUORIDE, DENTAL RINSE, 0.2 % SOLN [id number]  Take by mouth as directed.  Patient not taking: Reported on 04/08/2024   [provider]  Active   tretinoin (RETIN-A) 0.025 % cream [id number] Yes Apply a small amount (pea size) twice a week , at night time to affected areas [provider]  Active             Recommendation:   Continue Current Plan of Care  Follow Up Plan:   Telephone follow-up 3 weeks  [name], LCSW [facility]  [facility], [facility] Clinical Social Worker Direct Dial: [phone]  Fax: [phone] Website: [email or web address] 11:47 AM

## 2024-04-09 DIAGNOSIS — G129 Spinal muscular atrophy, unspecified: Secondary | ICD-10-CM | POA: Diagnosis not present

## 2024-04-10 DIAGNOSIS — G129 Spinal muscular atrophy, unspecified: Secondary | ICD-10-CM | POA: Diagnosis not present

## 2024-04-13 DIAGNOSIS — G129 Spinal muscular atrophy, unspecified: Secondary | ICD-10-CM | POA: Diagnosis not present

## 2024-04-14 DIAGNOSIS — G129 Spinal muscular atrophy, unspecified: Secondary | ICD-10-CM | POA: Diagnosis not present

## 2024-04-15 DIAGNOSIS — G129 Spinal muscular atrophy, unspecified: Secondary | ICD-10-CM | POA: Diagnosis not present

## 2024-04-16 DIAGNOSIS — G129 Spinal muscular atrophy, unspecified: Secondary | ICD-10-CM | POA: Diagnosis not present

## 2024-04-17 DIAGNOSIS — G129 Spinal muscular atrophy, unspecified: Secondary | ICD-10-CM | POA: Diagnosis not present

## 2024-04-20 DIAGNOSIS — G129 Spinal muscular atrophy, unspecified: Secondary | ICD-10-CM | POA: Diagnosis not present

## 2024-04-21 DIAGNOSIS — G129 Spinal muscular atrophy, unspecified: Secondary | ICD-10-CM | POA: Diagnosis not present

## 2024-04-22 DIAGNOSIS — G129 Spinal muscular atrophy, unspecified: Secondary | ICD-10-CM | POA: Diagnosis not present

## 2024-04-23 DIAGNOSIS — G129 Spinal muscular atrophy, unspecified: Secondary | ICD-10-CM | POA: Diagnosis not present

## 2024-04-24 DIAGNOSIS — G129 Spinal muscular atrophy, unspecified: Secondary | ICD-10-CM | POA: Diagnosis not present

## 2024-04-27 DIAGNOSIS — G129 Spinal muscular atrophy, unspecified: Secondary | ICD-10-CM | POA: Diagnosis not present

## 2024-04-28 DIAGNOSIS — G129 Spinal muscular atrophy, unspecified: Secondary | ICD-10-CM | POA: Diagnosis not present

## 2024-04-29 ENCOUNTER — Encounter: Payer: Self-pay | Admitting: Licensed Clinical Social Worker

## 2024-04-29 ENCOUNTER — Telehealth: Payer: Self-pay | Admitting: Licensed Clinical Social Worker

## 2024-04-29 DIAGNOSIS — G129 Spinal muscular atrophy, unspecified: Secondary | ICD-10-CM | POA: Diagnosis not present

## 2024-04-30 DIAGNOSIS — G129 Spinal muscular atrophy, unspecified: Secondary | ICD-10-CM | POA: Diagnosis not present

## 2024-05-01 DIAGNOSIS — G129 Spinal muscular atrophy, unspecified: Secondary | ICD-10-CM | POA: Diagnosis not present

## 2024-05-04 DIAGNOSIS — G129 Spinal muscular atrophy, unspecified: Secondary | ICD-10-CM | POA: Diagnosis not present

## 2024-05-05 DIAGNOSIS — G129 Spinal muscular atrophy, unspecified: Secondary | ICD-10-CM | POA: Diagnosis not present

## 2024-05-06 DIAGNOSIS — G129 Spinal muscular atrophy, unspecified: Secondary | ICD-10-CM | POA: Diagnosis not present

## 2024-05-07 DIAGNOSIS — G129 Spinal muscular atrophy, unspecified: Secondary | ICD-10-CM | POA: Diagnosis not present

## 2024-05-08 DIAGNOSIS — G129 Spinal muscular atrophy, unspecified: Secondary | ICD-10-CM | POA: Diagnosis not present

## 2024-05-11 DIAGNOSIS — G129 Spinal muscular atrophy, unspecified: Secondary | ICD-10-CM | POA: Diagnosis not present

## 2024-05-12 DIAGNOSIS — G129 Spinal muscular atrophy, unspecified: Secondary | ICD-10-CM | POA: Diagnosis not present

## 2024-05-13 DIAGNOSIS — G129 Spinal muscular atrophy, unspecified: Secondary | ICD-10-CM | POA: Diagnosis not present

## 2024-05-14 DIAGNOSIS — G129 Spinal muscular atrophy, unspecified: Secondary | ICD-10-CM | POA: Diagnosis not present

## 2024-05-15 DIAGNOSIS — G129 Spinal muscular atrophy, unspecified: Secondary | ICD-10-CM | POA: Diagnosis not present

## 2024-05-18 DIAGNOSIS — G129 Spinal muscular atrophy, unspecified: Secondary | ICD-10-CM | POA: Diagnosis not present

## 2024-05-19 DIAGNOSIS — G129 Spinal muscular atrophy, unspecified: Secondary | ICD-10-CM | POA: Diagnosis not present

## 2024-05-20 DIAGNOSIS — G129 Spinal muscular atrophy, unspecified: Secondary | ICD-10-CM | POA: Diagnosis not present

## 2024-05-21 DIAGNOSIS — G129 Spinal muscular atrophy, unspecified: Secondary | ICD-10-CM | POA: Diagnosis not present

## 2024-05-22 DIAGNOSIS — G129 Spinal muscular atrophy, unspecified: Secondary | ICD-10-CM | POA: Diagnosis not present

## 2024-05-25 DIAGNOSIS — G129 Spinal muscular atrophy, unspecified: Secondary | ICD-10-CM | POA: Diagnosis not present

## 2024-05-26 DIAGNOSIS — G129 Spinal muscular atrophy, unspecified: Secondary | ICD-10-CM | POA: Diagnosis not present

## 2024-05-27 DIAGNOSIS — G129 Spinal muscular atrophy, unspecified: Secondary | ICD-10-CM | POA: Diagnosis not present

## 2024-05-28 DIAGNOSIS — G129 Spinal muscular atrophy, unspecified: Secondary | ICD-10-CM | POA: Diagnosis not present

## 2024-05-29 DIAGNOSIS — G129 Spinal muscular atrophy, unspecified: Secondary | ICD-10-CM | POA: Diagnosis not present

## 2024-06-01 DIAGNOSIS — G129 Spinal muscular atrophy, unspecified: Secondary | ICD-10-CM | POA: Diagnosis not present

## 2024-06-02 DIAGNOSIS — G129 Spinal muscular atrophy, unspecified: Secondary | ICD-10-CM | POA: Diagnosis not present

## 2024-06-03 DIAGNOSIS — G129 Spinal muscular atrophy, unspecified: Secondary | ICD-10-CM | POA: Diagnosis not present

## 2024-06-04 DIAGNOSIS — G129 Spinal muscular atrophy, unspecified: Secondary | ICD-10-CM | POA: Diagnosis not present

## 2024-06-05 DIAGNOSIS — G129 Spinal muscular atrophy, unspecified: Secondary | ICD-10-CM | POA: Diagnosis not present

## 2024-06-08 DIAGNOSIS — G129 Spinal muscular atrophy, unspecified: Secondary | ICD-10-CM | POA: Diagnosis not present

## 2024-06-09 DIAGNOSIS — G129 Spinal muscular atrophy, unspecified: Secondary | ICD-10-CM | POA: Diagnosis not present

## 2024-06-10 ENCOUNTER — Telehealth: Payer: Self-pay | Admitting: *Deleted

## 2024-06-10 DIAGNOSIS — G129 Spinal muscular atrophy, unspecified: Secondary | ICD-10-CM | POA: Diagnosis not present

## 2024-06-10 NOTE — Progress Notes (Unsigned)
 Complex Care Management Care Guide Note  06/10/2024 Name: Wayne Nichols MRN: [id number] DOB: 06-28-1991  Wayne Nichols is a 33 y.o. year old male who is a primary care patient of Newlin, Enobong, MD and is actively engaged with the care management team. I reached out to Wayne Nichols by phone today to assist with re-scheduling  with the Licensed Clinical Child psychotherapist.  Follow up plan: Unsuccessful telephone outreach attempt made. A HIPAA compliant phone message was left for the patient providing contact information and requesting a return call.  Harlene Satterfield  [facility]  [facility], [facility] Guide  Direct Dial: [phone]  Fax [phone]

## 2024-06-11 DIAGNOSIS — G129 Spinal muscular atrophy, unspecified: Secondary | ICD-10-CM | POA: Diagnosis not present

## 2024-06-11 NOTE — Progress Notes (Signed)
 Complex Care Management Care Guide Note  06/11/2024 Name: Wayne Nichols MRN: [id number] DOB: 09-30-1991  Wayne Nichols is a 33 y.o. year old male who is a primary care patient of Newlin, Enobong, MD and is actively engaged with the care management team. I reached out to Wayne Nichols by phone today to assist with scheduling  with the Licensed Clinical Social Worker.  Follow up plan: Telephone appointment with complex care management team member scheduled for:  07/09/24  Harlene Satterfield  [facility]  [facility], [facility] Guide  Direct Dial: [phone]  Fax [phone]

## 2024-06-12 DIAGNOSIS — G129 Spinal muscular atrophy, unspecified: Secondary | ICD-10-CM | POA: Diagnosis not present

## 2024-06-16 DIAGNOSIS — G129 Spinal muscular atrophy, unspecified: Secondary | ICD-10-CM | POA: Diagnosis not present

## 2024-06-17 DIAGNOSIS — G129 Spinal muscular atrophy, unspecified: Secondary | ICD-10-CM | POA: Diagnosis not present

## 2024-06-18 DIAGNOSIS — G129 Spinal muscular atrophy, unspecified: Secondary | ICD-10-CM | POA: Diagnosis not present

## 2024-06-19 DIAGNOSIS — G129 Spinal muscular atrophy, unspecified: Secondary | ICD-10-CM | POA: Diagnosis not present

## 2024-06-22 DIAGNOSIS — G129 Spinal muscular atrophy, unspecified: Secondary | ICD-10-CM | POA: Diagnosis not present

## 2024-06-23 DIAGNOSIS — G129 Spinal muscular atrophy, unspecified: Secondary | ICD-10-CM | POA: Diagnosis not present

## 2024-06-24 DIAGNOSIS — G129 Spinal muscular atrophy, unspecified: Secondary | ICD-10-CM | POA: Diagnosis not present

## 2024-06-25 DIAGNOSIS — G129 Spinal muscular atrophy, unspecified: Secondary | ICD-10-CM | POA: Diagnosis not present

## 2024-06-26 DIAGNOSIS — G129 Spinal muscular atrophy, unspecified: Secondary | ICD-10-CM | POA: Diagnosis not present

## 2024-07-01 DIAGNOSIS — G129 Spinal muscular atrophy, unspecified: Secondary | ICD-10-CM | POA: Diagnosis not present

## 2024-07-02 DIAGNOSIS — G129 Spinal muscular atrophy, unspecified: Secondary | ICD-10-CM | POA: Diagnosis not present

## 2024-07-03 DIAGNOSIS — G129 Spinal muscular atrophy, unspecified: Secondary | ICD-10-CM | POA: Diagnosis not present

## 2024-07-06 DIAGNOSIS — G129 Spinal muscular atrophy, unspecified: Secondary | ICD-10-CM | POA: Diagnosis not present

## 2024-07-09 ENCOUNTER — Encounter: Payer: Self-pay | Admitting: Licensed Clinical Social Worker

## 2024-07-09 ENCOUNTER — Telehealth: Payer: Self-pay | Admitting: Licensed Clinical Social Worker

## 2024-07-09 NOTE — Patient Instructions (Signed)
 Belvie LILLETTE Hoyle - I am sorry I was unable to reach you today for our scheduled appointment. I work with Newlin, Enobong, MD and am calling to support your healthcare needs. Please contact me at [phone] at your earliest convenience. I look forward to speaking with you soon.   Thank you,  Rolin Kerns, LCSW [place]  [facility], [facility] Clinical Social Worker Direct Dial: [phone]  Fax: [phone] Website: [email or web address] 4:05 PM

## 2024-07-21 ENCOUNTER — Encounter: Payer: Self-pay | Admitting: Licensed Clinical Social Worker

## 2024-07-21 ENCOUNTER — Other Ambulatory Visit: Payer: Self-pay | Admitting: Licensed Clinical Social Worker

## 2024-07-21 NOTE — Patient Outreach (Signed)
 Patient returned LCSW's call. Patient reports that other things have taken priority resulting in not having established with therapy. Pt reports having an upcoming surgery scheduled and agreed to f/up with PCP and/or LCSW if needs arise in the future. Resources have been emailed to patient for future reference, if needed.

## 2024-07-21 NOTE — Patient Instructions (Signed)
 Visit Information  We have been unable to make contact with the patient x 3 attempts. Closing from Complex Care Management..  Please call the care guide team at [phone] if you need to cancel, schedule, or reschedule an appointment.   Please call the Suicide and Crisis Lifeline: 988 go to [facility] [address] [place] [phone]) call 911 if you are experiencing a Mental Health or Behavioral Health Crisis or need someone to talk to.  Rolin Kerns, LCSW [place]  [facility], [facility] Clinical Social Worker Direct Dial: [phone]  Fax: [phone] Website: [email or web address] 10:08 AM

## 2024-07-21 NOTE — Patient Outreach (Signed)
 Complex Care Management   Visit Note  07/21/2024  Name:  Wayne Nichols MRN: [id number] DOB: 1991/01/15  Situation: Referral received for Complex Care Management related to Mental/Behavioral Health diagnosis Anxiety and Depression I obtained verbal consent from Patient.    Background:   Past Medical History:  Diagnosis Date   Complication of anesthesia    Patient has issues opening his mouth wide and taking deep breaths. Hx of spinal muscular atrophy. Noctural desaturations 2021 sleep study, non-invasive ventilation at night recommended.   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   Patient uses Trilogy Ventilator at home   Hemorrhoids    external   Seasonal allergies    Spinal muscular atrophy [facility])    Wheelchair dependent     Assessment: Patient Reported Symptoms:  Cognitive        Neurological      HEENT        Cardiovascular      Respiratory      Endocrine      Gastrointestinal        Genitourinary      Integumentary      Musculoskeletal          Psychosocial            07/21/2024    PHQ2-9 Depression Screening   Little interest or pleasure in doing things    Feeling down, depressed, or hopeless    PHQ-2 - Total Score    Trouble falling or staying asleep, or sleeping too much    Feeling tired or having little energy    Poor appetite or overeating     Feeling bad about yourself - or that you are a failure or have let yourself or your family down    Trouble concentrating on things, such as reading the newspaper or watching television    Moving or speaking so slowly that other people could have noticed.  Or the opposite - being so fidgety or restless that you have been moving around a lot more than usual    Thoughts that you would be better off dead, or hurting yourself in some way    PHQ2-9 Total Score    If you checked off any problems, how difficult have these problems made it for you to do your work, take care of things at home, or get  along with other people    Depression Interventions/Treatment      There were no vitals filed for this visit.  Medications Reviewed Today   Medications were not reviewed in this encounter     Recommendation:   Continue Current Plan of Care  Follow Up Plan:   We have been unable to make contact with the patient x 3 attempts. Closing from Complex Care Management  Rolin Kerns, LCSW [place]  [facility], [facility] Clinical Social Worker Direct Dial: [phone]  Fax: [phone] Website: [email or web address] 10:07 AM
